# Patient Record
Sex: Male | Born: 1945
Health system: Southern US, Community
[De-identification: ages and names within clinical notes are randomized; demographics above are authoritative.]

## PROBLEM LIST (undated history)

## (undated) DIAGNOSIS — N183 Chronic kidney disease, stage 3 unspecified: Secondary | ICD-10-CM

## (undated) DIAGNOSIS — I739 Peripheral vascular disease, unspecified: Secondary | ICD-10-CM

## (undated) DIAGNOSIS — F419 Anxiety disorder, unspecified: Secondary | ICD-10-CM

## (undated) DIAGNOSIS — F17200 Nicotine dependence, unspecified, uncomplicated: Secondary | ICD-10-CM

## (undated) DIAGNOSIS — Z95818 Presence of other cardiac implants and grafts: Secondary | ICD-10-CM

## (undated) DIAGNOSIS — D126 Benign neoplasm of colon, unspecified: Secondary | ICD-10-CM

## (undated) DIAGNOSIS — E781 Pure hyperglyceridemia: Secondary | ICD-10-CM

## (undated) DIAGNOSIS — T7840XA Allergy, unspecified, initial encounter: Secondary | ICD-10-CM

## (undated) DIAGNOSIS — I639 Cerebral infarction, unspecified: Secondary | ICD-10-CM

## (undated) DIAGNOSIS — E785 Hyperlipidemia, unspecified: Secondary | ICD-10-CM

## (undated) HISTORY — DX: Benign neoplasm of colon, unspecified: D12.6

## (undated) HISTORY — DX: Nicotine dependence, unspecified, uncomplicated: F17.200

## (undated) HISTORY — DX: Peripheral vascular disease, unspecified: I73.9

## (undated) HISTORY — PX: EYE SURGERY: SHX253

## (undated) HISTORY — DX: Allergy, unspecified, initial encounter: T78.40XA

## (undated) HISTORY — DX: Hyperlipidemia, unspecified: E78.5

## (undated) HISTORY — DX: Chronic kidney disease, stage 3 (moderate): N18.3

## (undated) HISTORY — DX: Pure hyperglyceridemia: E78.1

## (undated) HISTORY — DX: Chronic kidney disease, stage 3 unspecified: N18.30

---

## 1985-01-06 HISTORY — PX: CHOLECYSTECTOMY: SHX55

## 2012-06-30 ENCOUNTER — Encounter: Payer: Self-pay | Admitting: Family Medicine

## 2012-06-30 ENCOUNTER — Ambulatory Visit (INDEPENDENT_AMBULATORY_CARE_PROVIDER_SITE_OTHER): Payer: Medicare Other | Admitting: Family Medicine

## 2012-06-30 VITALS — BP 130/70 | HR 78 | Temp 97.6°F | Resp 16 | Ht 68.0 in | Wt 173.0 lb

## 2012-06-30 DIAGNOSIS — F17200 Nicotine dependence, unspecified, uncomplicated: Secondary | ICD-10-CM

## 2012-06-30 DIAGNOSIS — K635 Polyp of colon: Secondary | ICD-10-CM

## 2012-06-30 DIAGNOSIS — D126 Benign neoplasm of colon, unspecified: Secondary | ICD-10-CM

## 2012-06-30 DIAGNOSIS — Z125 Encounter for screening for malignant neoplasm of prostate: Secondary | ICD-10-CM

## 2012-06-30 DIAGNOSIS — E785 Hyperlipidemia, unspecified: Secondary | ICD-10-CM

## 2012-06-30 DIAGNOSIS — Z1211 Encounter for screening for malignant neoplasm of colon: Secondary | ICD-10-CM

## 2012-06-30 LAB — LIPID PANEL
HDL: 63 mg/dL (ref >39)
LDL Cholesterol: 94 mg/dL (ref 0–99)
Total CHOL/HDL Ratio: 2.8 Ratio
Triglycerides: 92 mg/dL (ref <150)
VLDL: 18 mg/dL (ref 0–40)

## 2012-06-30 LAB — COMPREHENSIVE METABOLIC PANEL
ALT: 25 U/L (ref 0–53)
AST: 21 U/L (ref 0–37)
Alkaline Phosphatase: 75 U/L (ref 39–117)
BUN: 18 mg/dL (ref 6–23)
Creat: 1.48 mg/dL — ABNORMAL HIGH (ref 0.50–1.35)
Total Bilirubin: 0.6 mg/dL (ref 0.3–1.2)

## 2012-06-30 LAB — CBC
HCT: 44.8 % (ref 39.0–52.0)
Hemoglobin: 15.4 g/dL (ref 13.0–17.0)
MCHC: 34.4 g/dL (ref 30.0–36.0)
MCV: 86.7 fL (ref 78.0–100.0)
RDW: 14.3 % (ref 11.5–15.5)

## 2012-06-30 MED ORDER — PRAVASTATIN SODIUM 40 MG PO TABS: 40.0000 mg | ORAL_TABLET | Freq: Every day | ORAL | Status: DC

## 2012-06-30 MED ORDER — FENOFIBRATE 160 MG PO TABS: 160.0000 mg | ORAL_TABLET | Freq: Every day | ORAL | Status: DC

## 2012-06-30 NOTE — Patient Instructions (Signed)
I recommend eye visit once a year I recommend dental visit every 6 months Goal is to  Exercise 30 minutes 5 days a week We will send a letter with lab results  Referral for colonoscopy  F/U 1 year as needed

## 2012-06-30 NOTE — Assessment & Plan Note (Signed)
LFT and FLP done today, within normal limits Continue pravastatin current dose

## 2012-06-30 NOTE — Assessment & Plan Note (Signed)
Counseled on tobacco cessation, pt not ready to quit

## 2012-06-30 NOTE — Addendum Note (Signed)
Addended by: Milinda Antis F on: 06/30/2012 09:49 PM   Modules accepted: Orders

## 2012-06-30 NOTE — Assessment & Plan Note (Signed)
Refer to GI, last colonoscopy in [place] in 2009-2 polyps at that time Previous Colonoscopy 2005

## 2012-06-30 NOTE — Progress Notes (Signed)
  Subjective:    Patient ID: Christian [name], male    DOB: [birth year], 67 y.o.   MRN: [id number]  HPI  Pt here for CPE he has no specific concerns, doing well.  Medications and History reviewed Seen by opthomolothogy Smokes 6-7 cig/day not interested in quitting Immunizations UTD  Review of Systems  GEN- denies fatigue, fever, weight loss,weakness, recent illness HEENT- denies eye drainage, change in vision, nasal discharge, CVS- denies chest pain, palpitations RESP- denies SOB, cough, wheeze ABD- denies N/V, change in stools, abd pain GU- denies dysuria, hematuria, dribbling, incontinence MSK- denies joint pain, muscle aches, injury Neuro- denies headache, dizziness, syncope, seizure activity      Objective:   Physical Exam GEN- NAD, alert and oriented x3 HEENT- PERRL, EOMI, non injected sclera, pink conjunctiva, MMM, oropharynx clear Neck- Supple, no bruit CVS- RRR, no murmur RESP-CTAB ABD-NABS,soft,NT,ND Rectum- normal rectal tone, no external appearance, FOBT neg, prostate smooth no nodules, firm upper quadrants EXT- No edema Pulses- Radial, DP- 2+ Skin- in tact, no open lesions NEURO-CNII-XII intact no focal deficits          Assessment & Plan:   CPE- Physical completed, due for repeat colonoscopy

## 2012-07-05 ENCOUNTER — Other Ambulatory Visit: Payer: Self-pay | Admitting: Family Medicine

## 2012-07-15 ENCOUNTER — Encounter: Payer: Self-pay | Admitting: Gastroenterology

## 2012-08-06 ENCOUNTER — Other Ambulatory Visit: Payer: Medicare Other

## 2012-08-06 DIAGNOSIS — R7989 Other specified abnormal findings of blood chemistry: Secondary | ICD-10-CM

## 2012-08-06 LAB — COMPLETE METABOLIC PANEL WITH GFR
ALT: 22 U/L (ref 0–53)
Albumin: 4 g/dL (ref 3.5–5.2)
CO2: 25 mEq/L (ref 19–32)
Calcium: 9.2 mg/dL (ref 8.4–10.5)
Chloride: 104 mEq/L (ref 96–112)
GFR, Est African American: 50 mL/min — ABNORMAL LOW
Potassium: 4.4 mEq/L (ref 3.5–5.3)
Sodium: 138 mEq/L (ref 135–145)
Total Protein: 6.4 g/dL (ref 6.0–8.3)

## 2012-08-08 ENCOUNTER — Other Ambulatory Visit: Payer: Self-pay | Admitting: Family Medicine

## 2012-08-08 DIAGNOSIS — N289 Disorder of kidney and ureter, unspecified: Secondary | ICD-10-CM

## 2012-08-13 ENCOUNTER — Ambulatory Visit
Admission: RE | Admit: 2012-08-13 | Discharge: 2012-08-13 | Disposition: A | Discharge status: Home / Self Care | Payer: Medicare Other | Source: Ambulatory Visit | Attending: Family Medicine | Admitting: Family Medicine

## 2012-08-13 DIAGNOSIS — N289 Disorder of kidney and ureter, unspecified: Secondary | ICD-10-CM

## 2012-08-17 ENCOUNTER — Other Ambulatory Visit: Payer: Self-pay | Admitting: Family Medicine

## 2012-08-17 DIAGNOSIS — N289 Disorder of kidney and ureter, unspecified: Secondary | ICD-10-CM

## 2012-09-29 ENCOUNTER — Telehealth: Payer: Self-pay | Admitting: *Deleted

## 2012-09-29 ENCOUNTER — Ambulatory Visit (AMBULATORY_SURGERY_CENTER): Payer: Self-pay | Admitting: *Deleted

## 2012-09-29 ENCOUNTER — Encounter: Payer: Self-pay | Admitting: Gastroenterology

## 2012-09-29 VITALS — Ht 69.0 in | Wt 180.4 lb

## 2012-09-29 DIAGNOSIS — Z86010 Personal history of colonic polyps: Secondary | ICD-10-CM

## 2012-09-29 MED ORDER — NA SULFATE-K SULFATE-MG SULF 17.5-3.13-1.6 GM/177ML PO SOLN: 1.0000 | Freq: Once | ORAL | Status: DC

## 2012-09-29 NOTE — Telephone Encounter (Signed)
Pt scheduled for direct colonoscopy with Dr. Arlyce Dice Friday 10/3.  Last colonoscopy 2009 with Dr. Almira Bar in [facility] [place], [place].  Pt says he had polyps.  Pt had colonoscopy 2005 with same MD, hyperplastic polyps.  This report is in EPIC, no path report.  Release of information form signed and given to [employer].

## 2012-09-29 NOTE — Telephone Encounter (Signed)
Faxed today

## 2012-10-08 ENCOUNTER — Encounter: Payer: Medicare Other | Admitting: Gastroenterology

## 2012-10-18 ENCOUNTER — Telehealth: Payer: Self-pay | Admitting: Gastroenterology

## 2012-10-18 NOTE — Telephone Encounter (Signed)
Explained to patient that we have not received path report yet. Tried to contact that office and they are closed.  Dr Arlyce Dice I explained to pt unless he heard from [place] that his colonoscopy is scheduled for the 16th

## 2012-10-18 NOTE — Telephone Encounter (Signed)
Hx of colon polyps which notes say hyperplastic in 2005. But we have no path to confirm it   So this is a recall colon

## 2012-10-18 NOTE — Telephone Encounter (Signed)
So continue with colonoscopy right?

## 2012-10-18 NOTE — Telephone Encounter (Signed)
Ok

## 2012-10-18 NOTE — Telephone Encounter (Signed)
What is the reason for the colonoscopy?

## 2012-10-19 NOTE — Telephone Encounter (Signed)
Ok patient aware to continue with Colonoscopy on the 16th

## 2012-10-19 NOTE — Telephone Encounter (Signed)
Needs colo 10 years from previous colo which is about now

## 2012-10-21 ENCOUNTER — Ambulatory Visit (AMBULATORY_SURGERY_CENTER): Payer: Medicare Other | Admitting: Gastroenterology

## 2012-10-21 ENCOUNTER — Encounter: Payer: Self-pay | Admitting: Gastroenterology

## 2012-10-21 VITALS — BP 126/82 | HR 60 | Temp 96.6°F | Resp 15 | Ht 69.0 in | Wt 180.0 lb

## 2012-10-21 DIAGNOSIS — K5730 Diverticulosis of large intestine without perforation or abscess without bleeding: Secondary | ICD-10-CM

## 2012-10-21 DIAGNOSIS — D126 Benign neoplasm of colon, unspecified: Secondary | ICD-10-CM

## 2012-10-21 DIAGNOSIS — K648 Other hemorrhoids: Secondary | ICD-10-CM

## 2012-10-21 DIAGNOSIS — Z86010 Personal history of colonic polyps: Secondary | ICD-10-CM

## 2012-10-21 MED ORDER — SODIUM CHLORIDE 0.9 % IV SOLN: 500.0000 mL | INTRAVENOUS | Status: DC

## 2012-10-21 NOTE — Progress Notes (Signed)
Patient did not experience any of the following events: a burn prior to discharge; a fall within the facility; wrong site/side/patient/procedure/implant event; or a hospital transfer or hospital admission upon discharge from the facility. (G8907) Patient did not have preoperative order for IV antibiotic SSI prophylaxis. (G8918) Patient did not have preoperative order for IV antibiotic SSI prophylaxis. (G8918)  

## 2012-10-21 NOTE — Progress Notes (Signed)
Report to pacu rn, vss, bbs=clear 

## 2012-10-21 NOTE — Op Note (Signed)
[facility] [address] [place], [address]   COLONOSCOPY PROCEDURE REPORT  PATIENT: Christian [name], Christian [name]  MR#: [id number] BIRTHDATE: December 31, 1945 , 67  yrs. old GENDER: Male ENDOSCOPIST: Louis Meckel, MD REFERRED OZ:[id number] Tanya Nones, M.D. PROCEDURE DATE:  10/21/2012 PROCEDURE:   Colonoscopy with snare polypectomy and Colonoscopy with cold biopsy polypectomy First Screening Colonoscopy - Avg.  risk and is 50 yrs.  old or older - No.  Prior Negative Screening - Now for repeat screening. N/A  History of Adenoma - Now for follow-up colonoscopy & has been > or = to 3 yrs.  Yes hx of adenoma.  Has been 3 or more years since last colonoscopy.  Polyps Removed Today? Yes. ASA CLASS:   Class II INDICATIONS:Patient's personal history of adenomatous colon polyps. polyps removed 2009 MEDICATIONS: MAC sedation, administered by CRNA and propofol (Diprivan) 350mg  IV  DESCRIPTION OF PROCEDURE:   After the risks benefits and alternatives of the procedure were thoroughly explained, informed consent was obtained.  A digital rectal exam revealed no abnormalities of the rectum.   The LB [id number]  endoscope was introduced through the anus and advanced to the cecum, which was identified by both the appendix and ileocecal valve. No adverse events experienced.   The quality of the prep was Suprep good  The instrument was then slowly withdrawn as the colon was fully examined.      COLON FINDINGS: A flat polyp measuring 3 mm in size was found in the ascending colon.  A polypectomy was performed with a cold snare. The resection was complete and the polyp tissue was completely retrieved.   A sessile polyp measuring 2-3 mm in size was found in the distal transverse colon.  A polypectomy was performed with cold forceps.   A sessile polyp measuring 5 mm in size was found in the descending colon.  A polypectomy was performed with a cold snare. The resection was complete and the polyp  tissue was completely retrieved.   A flat polyp measuring 2 mm in size was found in the sigmoid colon.  A polypectomy was performed with cold forceps. Mild diverticulosis was noted in the transverse colon.   Moderate diverticulosis was noted in the sigmoid colon.   Internal hemorrhoids were found.  Retroflexed views revealed no abnormalities. The time to cecum=3 minutes 18 seconds.  Withdrawal time=16 minutes 11 seconds.  The scope was withdrawn and the procedure completed. COMPLICATIONS: There were no complications.  ENDOSCOPIC IMPRESSION: 1.   Flat polyp measuring 3 mm in size was found in the ascending colon; polypectomy was performed with a cold snare 2.   Sessile polyp measuring 2-3 mm in size was found in the distal transverse colon; polypectomy was performed with cold forceps 3.   Sessile polyp measuring 5 mm in size was found in the descending colon; polypectomy was performed with a cold snare 4.   Flat polyp measuring 2 mm in size was found in the sigmoid colon; polypectomy was performed with cold forceps 5.   Mild diverticulosis was noted in the transverse colon 6.   Moderate diverticulosis was noted in the sigmoid colon 7.   Internal hemorrhoids  RECOMMENDATIONS: If the polyp(s) removed today are proven to be adenomatous (pre-cancerous) polyps, you will need a colonoscopy in 3 years. Otherwise you should continue to follow colorectal cancer screening guidelines for "routine risk" patients with a colonoscopy in 10 years.  You will receive a letter within 1-2 weeks with the results of your biopsy  as well as final recommendations.  Please call my office if you have not received a letter after 3 weeks.   eSigned:  Louis Meckel, MD 10/21/2012 9:26 AM   cc:   PATIENT NAME:  Christian [name] MR#: [id number]

## 2012-10-21 NOTE — Patient Instructions (Addendum)

## 2012-10-21 NOTE — Progress Notes (Signed)
Called to room to assist during endoscopic procedure.  Patient ID and intended procedure confirmed with present staff. Received instructions for my participation in the procedure from the performing physician.  

## 2012-10-22 ENCOUNTER — Telehealth: Payer: Self-pay | Admitting: *Deleted

## 2012-10-22 NOTE — Telephone Encounter (Signed)
  Follow up Call-  Call back number 10/21/2012  Post procedure Call Back phone  # [phone]  Permission to leave phone message Yes     Patient questions:  Do you have a fever, pain , or abdominal swelling? no Pain Score  0 *  Have you tolerated food without any problems? yes  Have you been able to return to your normal activities? yes  Do you have any questions about your discharge instructions: Diet   no Medications  no Follow up visit  no  Do you have questions or concerns about your Care? no  Actions: * If pain score is 4 or above: No action needed, pain <4.

## 2012-10-28 ENCOUNTER — Encounter: Payer: Self-pay | Admitting: Gastroenterology

## 2013-06-10 ENCOUNTER — Other Ambulatory Visit: Payer: Self-pay | Admitting: *Deleted

## 2013-06-10 MED ORDER — FENOFIBRATE 160 MG PO TABS: ORAL_TABLET | ORAL | Status: DC

## 2013-06-13 ENCOUNTER — Other Ambulatory Visit: Payer: Self-pay | Admitting: *Deleted

## 2013-06-13 MED ORDER — PRAVASTATIN SODIUM 40 MG PO TABS: 40.0000 mg | ORAL_TABLET | Freq: Every day | ORAL | Status: DC

## 2013-06-13 NOTE — Telephone Encounter (Signed)
Medication filled x1 with no refills.   Requires office visit before any further refills can be given.   Letter sent.  

## 2013-06-28 ENCOUNTER — Encounter: Payer: Medicare Other | Admitting: Family Medicine

## 2013-07-01 ENCOUNTER — Ambulatory Visit (INDEPENDENT_AMBULATORY_CARE_PROVIDER_SITE_OTHER): Payer: Medicare Other | Admitting: Family Medicine

## 2013-07-01 ENCOUNTER — Encounter: Payer: Self-pay | Admitting: Family Medicine

## 2013-07-01 VITALS — BP 110/68 | HR 60 | Temp 97.0°F | Resp 14 | Ht 69.5 in | Wt 175.0 lb

## 2013-07-01 DIAGNOSIS — Z0000 Encounter for general adult medical examination without abnormal findings: Secondary | ICD-10-CM

## 2013-07-01 DIAGNOSIS — E781 Pure hyperglyceridemia: Secondary | ICD-10-CM | POA: Insufficient documentation

## 2013-07-01 DIAGNOSIS — F17200 Nicotine dependence, unspecified, uncomplicated: Secondary | ICD-10-CM | POA: Insufficient documentation

## 2013-07-01 DIAGNOSIS — Z79899 Other long term (current) drug therapy: Secondary | ICD-10-CM

## 2013-07-01 DIAGNOSIS — Z23 Encounter for immunization: Secondary | ICD-10-CM

## 2013-07-01 LAB — COMPLETE METABOLIC PANEL WITH GFR
ALT: 22 U/L (ref 0–53)
AST: 21 U/L (ref 0–37)
Albumin: 4.6 g/dL (ref 3.5–5.2)
Alkaline Phosphatase: 80 U/L (ref 39–117)
BUN: 16 mg/dL (ref 6–23)
CALCIUM: 9.5 mg/dL (ref 8.4–10.5)
CHLORIDE: 101 meq/L (ref 96–112)
CO2: 25 mEq/L (ref 19–32)
CREATININE: 1.42 mg/dL — AB (ref 0.50–1.35)
GFR, EST AFRICAN AMERICAN: 58 mL/min — AB
GFR, EST NON AFRICAN AMERICAN: 50 mL/min — AB
GLUCOSE: 103 mg/dL — AB (ref 70–99)
Potassium: 5 mEq/L (ref 3.5–5.3)
Sodium: 139 mEq/L (ref 135–145)
Total Bilirubin: 0.6 mg/dL (ref 0.2–1.2)
Total Protein: 6.9 g/dL (ref 6.0–8.3)

## 2013-07-01 LAB — LIPID PANEL
CHOL/HDL RATIO: 2.5 ratio
CHOLESTEROL: 165 mg/dL (ref 0–200)
HDL: 65 mg/dL (ref >39)
LDL Cholesterol: 80 mg/dL (ref 0–99)
Triglycerides: 102 mg/dL (ref <150)
VLDL: 20 mg/dL (ref 0–40)

## 2013-07-01 LAB — CBC WITH DIFFERENTIAL/PLATELET
Basophils Absolute: 0.1 10*3/uL (ref 0.0–0.1)
Basophils Relative: 1 % (ref 0–1)
Eosinophils Absolute: 0.3 10*3/uL (ref 0.0–0.7)
Eosinophils Relative: 4 % (ref 0–5)
HCT: 44.2 % (ref 39.0–52.0)
Hemoglobin: 15.2 g/dL (ref 13.0–17.0)
LYMPHS ABS: 2.9 10*3/uL (ref 0.7–4.0)
LYMPHS PCT: 38 % (ref 12–46)
MCH: 29.6 pg (ref 26.0–34.0)
MCHC: 34.4 g/dL (ref 30.0–36.0)
MCV: 86 fL (ref 78.0–100.0)
Monocytes Absolute: 0.6 10*3/uL (ref 0.1–1.0)
Monocytes Relative: 8 % (ref 3–12)
NEUTROS ABS: 3.8 10*3/uL (ref 1.7–7.7)
NEUTROS PCT: 49 % (ref 43–77)
PLATELETS: 337 10*3/uL (ref 150–400)
RBC: 5.14 MIL/uL (ref 4.22–5.81)
RDW: 14.2 % (ref 11.5–15.5)
WBC: 7.7 10*3/uL (ref 4.0–10.5)

## 2013-07-01 MED ORDER — FLUTICASONE PROPIONATE 50 MCG/ACT NA SUSP: 2.0000 | Freq: Every day | NASAL | Status: DC

## 2013-07-01 NOTE — Progress Notes (Signed)
Subjective:    Patient ID: Christian [name], male    DOB: 07/09/1945, 68 y.o.   MRN: [id number]  HPI  Subjective:   Patient presents for Medicare Annual/Subsequent preventive examination. Patient's only concern is some mild sinus irritation and postnasal drip.  He continues to smoke. He has no desire to quit at the present time.  Review Past Medical/Family/Social: Past Medical History  Diagnosis Date  . Allergy     seasonal  . Hyperlipidemia   . Hypertriglyceridemia   . Smoker    Past Surgical History  Procedure Laterality Date  . Cholecystectomy  1987  . Eye surgery      lasik   Current Outpatient Prescriptions on File Prior to Visit  Medication Sig Dispense Refill  . cholecalciferol (VITAMIN D) 1000 UNITS tablet Take 1,000 Units by mouth daily.      . fenofibrate 160 MG tablet TAKE 1 TABLET EVERY DAY  90 tablet  3  . Multiple Vitamins-Minerals (MULTIVITAMIN WITH MINERALS) tablet Take 1 tablet by mouth daily.      . pravastatin (PRAVACHOL) 40 MG tablet Take 1 tablet (40 mg total) by mouth daily.  90 tablet  0  . vitamin C (ASCORBIC ACID) 500 MG tablet Take 500 mg by mouth daily.       No current facility-administered medications on file prior to visit.   No Known Allergies History   Social History  . Marital Status: Married    Spouse Name: N/A    Number of Children: N/A  . Years of Education: N/A   Occupational History  . Not on file.   Social History Main Topics  . Smoking status: Current Every Day Smoker -- 0.30 packs/day    Types: Cigarettes  . Smokeless tobacco: Never Used  . Alcohol Use: 3.5 oz/week    7 drink(s) per week  . Drug Use: No  . Sexual Activity: Not on file   Other Topics Concern  . Not on file   Social History Narrative  . No narrative on file   Family History  Problem Relation Age of Onset  . Hypertension Mother   . Stroke Mother   . Diabetes Mother     borderline  . Arthritis Father   . Colon cancer Neg Hx   . Esophageal cancer  Neg Hx   . Rectal cancer Neg Hx   . Stomach cancer Neg Hx    Depression Screen  (Note: if answer to either of the following is "Yes", a more complete depression screening is indicated)  Over the past two weeks, have you felt down, depressed or hopeless? No Over the past two weeks, have you felt little interest or pleasure in doing things? No Have you lost interest or pleasure in daily life? No Do you often feel hopeless? No Do you cry easily over simple problems? No   Activities of Daily Living  In your present state of health, do you have any difficulty performing the following activities?:  Driving? No  Managing money? No  Feeding yourself? No  Getting from bed to chair? No  Climbing a flight of stairs? No  Preparing food and eating?: No  Bathing or showering? No  Getting dressed: No  Getting to the toilet? No  Using the toilet:No  Moving around from place to place: No  In the past year have you fallen or had a near fall?:No  Are you sexually active? No  Do you have more than one partner? No   Hearing Difficulties:  No  Do you often ask people to speak up or repeat themselves? No  Do you experience ringing or noises in your ears? No Do you have difficulty understanding soft or whispered voices? No  Do you feel that you have a problem with memory? No Do you often misplace items? No  Do you feel safe at home? Yes  Cognitive Testing  Alert? Yes Normal Appearance?Yes  Oriented to person? Yes Place? Yes  Time? Yes  Recall of three objects? Yes  Can perform simple calculations? Yes  Displays appropriate judgment?Yes  Can read the correct time from a watch face?Yes   Screening Tests / Date Colonoscopy         10/14            Zostavax UTD Pneumovax Due Influenza Vaccine UTD Tetanus/tdap8 yrs ago  Review of Systems  All other systems reviewed and are negative.      Objective:   Physical Exam  Vitals reviewed. Constitutional: He is oriented to person, place, and  time. He appears well-developed and well-nourished. No distress.  HENT:  Head: Normocephalic and atraumatic.  Right Ear: External ear normal.  Left Ear: External ear normal.  Nose: Nose normal.  Mouth/Throat: Oropharynx is clear and moist. No oropharyngeal exudate.  Eyes: Conjunctivae and EOM are normal. Pupils are equal, round, and reactive to light. Right eye exhibits no discharge. Left eye exhibits no discharge. No scleral icterus.  Neck: Normal range of motion. Neck supple. No JVD present. No tracheal deviation present. No thyromegaly present.  Cardiovascular: Normal rate, regular rhythm, normal heart sounds and intact distal pulses.  Exam reveals no gallop and no friction rub.   No murmur heard. Pulmonary/Chest: Effort normal and breath sounds normal. No stridor. No respiratory distress. He has no wheezes. He has no rales. He exhibits no tenderness.  Abdominal: Soft. Bowel sounds are normal. He exhibits no distension and no mass. There is no tenderness. There is no rebound and no guarding.  Genitourinary: Rectum normal, prostate normal and penis normal.  Musculoskeletal: Normal range of motion. He exhibits no edema and no tenderness.  Lymphadenopathy:    He has no cervical adenopathy.  Neurological: He is alert and oriented to person, place, and time. He has normal reflexes. He displays normal reflexes. No cranial nerve deficit. He exhibits normal muscle tone. Coordination normal.  Skin: Skin is warm. No rash noted. He is not diaphoretic. No erythema. No pallor.  Psychiatric: He has a normal mood and affect. His behavior is normal. Judgment and thought content normal.          Assessment & Plan:  1. Routine general medical examination at a health care facility Physical exam is normal. I recommended smoking cessation. I discussed Chantix. The patient is unwilling to quit at the present time. He has never had Pneumovax 23. There give the patient Prevnar 13 today. He will receive  Pneumovax 23 next year. His colon cancer screening and prostate cancer screening are up to date. The remainder of his preventive care is up to date.  Screen negative for depression. Prescribed patient Flonase 2 sprays each nostril daily for allergic sinusitis/rhinitis. Medicare Attestation  I have personally reviewed:  The patient's medical and social history  Their use of alcohol, tobacco or illicit drugs  Their current medications and supplements  The patient's functional ability including ADLs,fall risks, home safety risks, cognitive, and hearing and visual impairment  Diet and physical activities  Evidence for depression or mood disorders  The patient's  weight, height, BMI, and visual acuity have been recorded in the chart. I have made referrals, counseling, and provided education to the patient based on review of the above and I have provided the patient with a written personalized care plan for preventive services.

## 2013-07-01 NOTE — Addendum Note (Signed)
Addended by: Shary Decamp B on: 07/01/2013 09:00 AM   Modules accepted: Orders

## 2013-07-02 LAB — PSA, MEDICARE: PSA: 0.53 ng/mL (ref <=4.00)

## 2013-09-07 ENCOUNTER — Other Ambulatory Visit: Payer: Self-pay | Admitting: Family Medicine

## 2014-03-10 ENCOUNTER — Encounter: Payer: Self-pay | Admitting: Family Medicine

## 2014-03-16 ENCOUNTER — Other Ambulatory Visit: Payer: Self-pay | Admitting: Family Medicine

## 2014-03-16 NOTE — Telephone Encounter (Signed)
Refill appropriate and filled per protocol. 

## 2014-06-16 ENCOUNTER — Other Ambulatory Visit: Payer: Self-pay | Admitting: Family Medicine

## 2014-06-16 NOTE — Telephone Encounter (Signed)
Refill appropriate and filled per protocol. 

## 2014-07-04 ENCOUNTER — Encounter: Payer: Medicare Other | Admitting: Family Medicine

## 2014-07-14 ENCOUNTER — Encounter: Payer: Self-pay | Admitting: Family Medicine

## 2014-07-27 ENCOUNTER — Ambulatory Visit (INDEPENDENT_AMBULATORY_CARE_PROVIDER_SITE_OTHER): Payer: Medicare Other | Admitting: Family Medicine

## 2014-07-27 ENCOUNTER — Encounter: Payer: Self-pay | Admitting: Family Medicine

## 2014-07-27 VITALS — BP 104/62 | HR 60 | Temp 97.9°F | Resp 14 | Ht 69.0 in | Wt 177.0 lb

## 2014-07-27 DIAGNOSIS — Z136 Encounter for screening for cardiovascular disorders: Secondary | ICD-10-CM | POA: Diagnosis not present

## 2014-07-27 DIAGNOSIS — E785 Hyperlipidemia, unspecified: Secondary | ICD-10-CM | POA: Diagnosis not present

## 2014-07-27 DIAGNOSIS — Z0000 Encounter for general adult medical examination without abnormal findings: Secondary | ICD-10-CM | POA: Diagnosis not present

## 2014-07-27 LAB — COMPLETE METABOLIC PANEL WITH GFR
ALT: 21 U/L (ref 0–53)
AST: 18 U/L (ref 0–37)
Albumin: 3.9 g/dL (ref 3.5–5.2)
Alkaline Phosphatase: 68 U/L (ref 39–117)
BILIRUBIN TOTAL: 0.6 mg/dL (ref 0.2–1.2)
BUN: 17 mg/dL (ref 6–23)
CO2: 23 meq/L (ref 19–32)
Calcium: 9.4 mg/dL (ref 8.4–10.5)
Chloride: 105 mEq/L (ref 96–112)
Creat: 1.32 mg/dL (ref 0.50–1.35)
GFR, EST AFRICAN AMERICAN: 63 mL/min
GFR, EST NON AFRICAN AMERICAN: 54 mL/min — AB
GLUCOSE: 98 mg/dL (ref 70–99)
POTASSIUM: 4.3 meq/L (ref 3.5–5.3)
Sodium: 140 mEq/L (ref 135–145)
Total Protein: 6.5 g/dL (ref 6.0–8.3)

## 2014-07-27 LAB — CBC WITH DIFFERENTIAL/PLATELET
BASOS ABS: 0.1 10*3/uL (ref 0.0–0.1)
Basophils Relative: 1 % (ref 0–1)
EOS PCT: 4 % (ref 0–5)
Eosinophils Absolute: 0.3 10*3/uL (ref 0.0–0.7)
HEMATOCRIT: 44.6 % (ref 39.0–52.0)
Hemoglobin: 14.7 g/dL (ref 13.0–17.0)
LYMPHS ABS: 3 10*3/uL (ref 0.7–4.0)
Lymphocytes Relative: 37 % (ref 12–46)
MCH: 29.1 pg (ref 26.0–34.0)
MCHC: 33 g/dL (ref 30.0–36.0)
MCV: 88.1 fL (ref 78.0–100.0)
MPV: 9.5 fL (ref 8.6–12.4)
Monocytes Absolute: 0.8 10*3/uL (ref 0.1–1.0)
Monocytes Relative: 10 % (ref 3–12)
NEUTROS ABS: 3.8 10*3/uL (ref 1.7–7.7)
Neutrophils Relative %: 48 % (ref 43–77)
Platelets: 318 10*3/uL (ref 150–400)
RBC: 5.06 MIL/uL (ref 4.22–5.81)
RDW: 14.7 % (ref 11.5–15.5)
WBC: 8 10*3/uL (ref 4.0–10.5)

## 2014-07-27 LAB — LIPID PANEL
CHOL/HDL RATIO: 2.9 ratio
Cholesterol: 164 mg/dL (ref 0–200)
HDL: 57 mg/dL (ref >=40)
LDL Cholesterol: 83 mg/dL (ref 0–99)
Triglycerides: 120 mg/dL (ref <150)
VLDL: 24 mg/dL (ref 0–40)

## 2014-07-27 NOTE — Progress Notes (Signed)
Subjective:    Patient ID: Christian [name], male    DOB: 19-Sep-1945, 69 y.o.   MRN: [id number]  HPI Patient is here today for complete physical exam. Aside from some seasonal allergies he has no complaints. His blood pressure is well controlled at 104/62. The remainder of his review of systems is normal. He denies any chest pain shortness of breath or dyspnea on exertion. Unfortunately the patient continues to smoke. At the present time he is not interested in smoking cessation. His last colonoscopy was 2014. He is due for prostate exam. Patient has had Pneumovax, Prevnar, Zostavax, and his tetanus vaccine. He is due for an ultrasound to screen for AAA given his age and smoking. Past Medical History  Diagnosis Date  . Allergy     seasonal  . Hyperlipidemia   . Hypertriglyceridemia   . Smoker    Past Surgical History  Procedure Laterality Date  . Cholecystectomy  1987  . Eye surgery      lasik   Current Outpatient Prescriptions on File Prior to Visit  Medication Sig Dispense Refill  . cholecalciferol (VITAMIN D) 1000 UNITS tablet Take 1,000 Units by mouth daily.    . fenofibrate 160 MG tablet TAKE 1 TABLET EVERY DAY 90 tablet 3  . fluticasone (FLONASE) 50 MCG/ACT nasal spray Place 2 sprays into both nostrils daily. 16 g 6  . Multiple Vitamins-Minerals (MULTIVITAMIN WITH MINERALS) tablet Take 1 tablet by mouth daily.    . pravastatin (PRAVACHOL) 40 MG tablet TAKE 1 TABLET (40 MG TOTAL) BY MOUTH DAILY. 90 tablet 1  . vitamin C (ASCORBIC ACID) 500 MG tablet Take 500 mg by mouth daily.     No current facility-administered medications on file prior to visit.   No Known Allergies History   Social History  . Marital Status: Married    Spouse Name: N/A  . Number of Children: N/A  . Years of Education: N/A   Occupational History  . Not on file.   Social History Main Topics  . Smoking status: Current Every Day Smoker -- 0.30 packs/day    Types: Cigarettes  . Smokeless tobacco:  Never Used  . Alcohol Use: 3.5 oz/week    7 drink(s) per week  . Drug Use: No  . Sexual Activity: Not on file   Other Topics Concern  . Not on file   Social History Narrative   Family History  Problem Relation Age of Onset  . Hypertension Mother   . Stroke Mother   . Diabetes Mother     borderline  . Arthritis Father   . Colon cancer Neg Hx   . Esophageal cancer Neg Hx   . Rectal cancer Neg Hx   . Stomach cancer Neg Hx       Review of Systems  All other systems reviewed and are negative.      Objective:   Physical Exam  Constitutional: He is oriented to person, place, and time. He appears well-developed and well-nourished. No distress.  HENT:  Head: Normocephalic and atraumatic.  Right Ear: External ear normal.  Left Ear: External ear normal.  Nose: Nose normal.  Mouth/Throat: Oropharynx is clear and moist. No oropharyngeal exudate.  Eyes: Conjunctivae and EOM are normal. Pupils are equal, round, and reactive to light. Right eye exhibits no discharge. Left eye exhibits no discharge. No scleral icterus.  Neck: Normal range of motion. Neck supple. No JVD present. No tracheal deviation present. No thyromegaly present.  Cardiovascular: Normal rate, regular rhythm, normal  heart sounds and intact distal pulses.  Exam reveals no gallop and no friction rub.   No murmur heard. Pulmonary/Chest: Effort normal and breath sounds normal. No stridor. No respiratory distress. He has no wheezes. He has no rales. He exhibits no tenderness.  Abdominal: Soft. Bowel sounds are normal. He exhibits no distension and no mass. There is no tenderness. There is no rebound and no guarding.  Genitourinary: Rectum normal, prostate normal and penis normal.  Musculoskeletal: Normal range of motion. He exhibits no edema or tenderness.  Lymphadenopathy:    He has no cervical adenopathy.  Neurological: He is alert and oriented to person, place, and time. He has normal reflexes. He displays normal  reflexes. No cranial nerve deficit. He exhibits normal muscle tone. Coordination normal.  Skin: Skin is warm. No rash noted. He is not diaphoretic. No erythema. No pallor.  Psychiatric: He has a normal mood and affect. His behavior is normal. Judgment and thought content normal.  Vitals reviewed.         Assessment & Plan:  Routine general medical examination at a health care facility - Plan: COMPLETE METABOLIC PANEL WITH GFR, CBC with Differential/Platelet, Lipid panel, PSA, Medicare  HLD (hyperlipidemia)  Screening for AAA (abdominal aortic aneurysm) - Plan: US Aorta  Patient's physical exam is normal. I encouraged smoking cessation. I will check a CBC, CMP, fasting lipid panel, and PSA. I will schedule the patient for an ultrasound to evaluate for abdominal aortic aneurysm given his age and smoking. Otherwise the remainder of his preventative care is up-to-date

## 2014-07-28 ENCOUNTER — Telehealth: Payer: Self-pay | Admitting: *Deleted

## 2014-07-28 LAB — PSA, MEDICARE: PSA: 0.48 ng/mL (ref <=4.00)

## 2014-07-28 NOTE — Telephone Encounter (Signed)
Pt has appt scheduled July  28 at 8:25am at [facility], ot is to be nothing by mouth after midnight night before his exam, left message on vm to return my call

## 2014-07-28 NOTE — Telephone Encounter (Signed)
Called pt about labs and he was informed of this appt, time and place.

## 2014-08-03 ENCOUNTER — Ambulatory Visit
Admission: RE | Admit: 2014-08-03 | Discharge: 2014-08-03 | Disposition: A | Discharge status: Home / Self Care | Payer: Medicare Other | Source: Ambulatory Visit | Attending: Family Medicine | Admitting: Family Medicine

## 2014-08-03 DIAGNOSIS — Z136 Encounter for screening for cardiovascular disorders: Secondary | ICD-10-CM

## 2014-09-10 ENCOUNTER — Other Ambulatory Visit: Payer: Self-pay | Admitting: Family Medicine

## 2014-10-27 ENCOUNTER — Other Ambulatory Visit: Payer: Self-pay | Admitting: Family Medicine

## 2014-10-27 NOTE — Telephone Encounter (Signed)
Medication refilled per protocol. 

## 2015-06-07 ENCOUNTER — Other Ambulatory Visit: Payer: Self-pay | Admitting: Family Medicine

## 2015-06-07 NOTE — Telephone Encounter (Signed)
Medication refilled per protocol. 

## 2015-06-15 ENCOUNTER — Other Ambulatory Visit: Payer: Self-pay | Admitting: Family Medicine

## 2015-07-30 ENCOUNTER — Encounter: Payer: Self-pay | Admitting: Family Medicine

## 2015-07-30 ENCOUNTER — Ambulatory Visit (INDEPENDENT_AMBULATORY_CARE_PROVIDER_SITE_OTHER): Payer: Medicare Other | Admitting: Family Medicine

## 2015-07-30 ENCOUNTER — Other Ambulatory Visit: Payer: Self-pay | Admitting: Family Medicine

## 2015-07-30 VITALS — BP 112/80 | HR 68 | Temp 98.1°F | Resp 16 | Ht 69.0 in | Wt 177.0 lb

## 2015-07-30 DIAGNOSIS — Z0000 Encounter for general adult medical examination without abnormal findings: Secondary | ICD-10-CM

## 2015-07-30 DIAGNOSIS — Z23 Encounter for immunization: Secondary | ICD-10-CM

## 2015-07-30 DIAGNOSIS — E785 Hyperlipidemia, unspecified: Secondary | ICD-10-CM | POA: Diagnosis not present

## 2015-07-30 DIAGNOSIS — Z125 Encounter for screening for malignant neoplasm of prostate: Secondary | ICD-10-CM | POA: Diagnosis not present

## 2015-07-30 LAB — CBC WITH DIFFERENTIAL/PLATELET
BASOS ABS: 158 {cells}/uL (ref 0–200)
Basophils Relative: 2 %
EOS ABS: 395 {cells}/uL (ref 15–500)
Eosinophils Relative: 5 %
HEMATOCRIT: 44.4 % (ref 38.5–50.0)
Hemoglobin: 14.8 g/dL (ref 13.0–17.0)
LYMPHS PCT: 42 %
Lymphs Abs: 3318 cells/uL (ref 850–3900)
MCH: 29.6 pg (ref 27.0–33.0)
MCHC: 33.3 g/dL (ref 32.0–36.0)
MCV: 88.8 fL (ref 80.0–100.0)
MONO ABS: 711 {cells}/uL (ref 200–950)
MPV: 9.6 fL (ref 7.5–12.5)
Monocytes Relative: 9 %
NEUTROS PCT: 42 %
Neutro Abs: 3318 cells/uL (ref 1500–7800)
Platelets: 311 10*3/uL (ref 140–400)
RBC: 5 MIL/uL (ref 4.20–5.80)
RDW: 14.3 % (ref 11.0–15.0)
WBC: 7.9 10*3/uL (ref 3.8–10.8)

## 2015-07-30 LAB — COMPLETE METABOLIC PANEL WITH GFR
ALK PHOS: 72 U/L (ref 40–115)
ALT: 20 U/L (ref 9–46)
AST: 17 U/L (ref 10–35)
Albumin: 4.1 g/dL (ref 3.6–5.1)
BILIRUBIN TOTAL: 0.4 mg/dL (ref 0.2–1.2)
BUN: 16 mg/dL (ref 7–25)
CALCIUM: 9.2 mg/dL (ref 8.6–10.3)
CHLORIDE: 109 mmol/L (ref 98–110)
CO2: 24 mmol/L (ref 20–31)
CREATININE: 1.31 mg/dL — AB (ref 0.70–1.18)
GFR, EST AFRICAN AMERICAN: 63 mL/min (ref >=60)
GFR, EST NON AFRICAN AMERICAN: 54 mL/min — AB (ref >=60)
Glucose, Bld: 100 mg/dL — ABNORMAL HIGH (ref 70–99)
Potassium: 5.1 mmol/L (ref 3.5–5.3)
Sodium: 142 mmol/L (ref 135–146)
Total Protein: 6 g/dL — ABNORMAL LOW (ref 6.1–8.1)

## 2015-07-30 LAB — LIPID PANEL
CHOLESTEROL: 149 mg/dL (ref 125–200)
HDL: 60 mg/dL (ref >=40)
LDL Cholesterol: 70 mg/dL (ref <130)
TRIGLYCERIDES: 94 mg/dL (ref <150)
Total CHOL/HDL Ratio: 2.5 Ratio (ref <=5.0)
VLDL: 19 mg/dL (ref <30)

## 2015-07-30 NOTE — Progress Notes (Signed)
Subjective:     Patient ID: Christian [name], male   DOB: 02/15/1945, 70 y.o.   MRN: [id number]  HPI Patient is here today for complete physical exam. He denies any complaints. He does have 2 small plantar's warts on the plantar aspect of his left foot. Both are approximately 2 mm in diameter. I recommended Dr. Felicie Morn wart remover over-the-counter to treat these. Otherwise he is due for Pneumovax 23. Shingles vaccine is up-to-date. Tetanus vaccine is up-to-date. Colonoscopy is up-to-date. He is due for digital rectal exam as well as a PSA. He declines hepatitis C screening. He is still smoking and has no desire to quit. Past Medical History:  Diagnosis Date  . Allergy    seasonal  . Hyperlipidemia   . Hypertriglyceridemia   . Smoker    Past Surgical History:  Procedure Laterality Date  . CHOLECYSTECTOMY  1987  . EYE SURGERY     lasik   Current Outpatient Prescriptions on File Prior to Visit  Medication Sig Dispense Refill  . cholecalciferol (VITAMIN D) 1000 UNITS tablet Take 1,000 Units by mouth daily.    . fenofibrate 160 MG tablet TAKE 1 TABLET EVERY DAY 90 tablet 0  . fluticasone (FLONASE) 50 MCG/ACT nasal spray PLACE 2 SPRAYS INTO BOTH NOSTRILS DAILY. 16 g 4  . Multiple Vitamins-Minerals (MULTIVITAMIN WITH MINERALS) tablet Take 1 tablet by mouth daily.    . pravastatin (PRAVACHOL) 40 MG tablet TAKE 1 TABLET BY MOUTH EVERY DAY 90 tablet 0  . vitamin C (ASCORBIC ACID) 500 MG tablet Take 500 mg by mouth daily.     No current facility-administered medications on file prior to visit.    No Known Allergies Social History   Social History  . Marital status: Married    Spouse name: N/A  . Number of children: N/A  . Years of education: N/A   Occupational History  . Not on file.   Social History Main Topics  . Smoking status: Current Every Day Smoker    Packs/day: 0.30    Types: Cigarettes  . Smokeless tobacco: Never Used  . Alcohol use 3.5 oz/week    7 drink(s) per week  .  Drug use: No  . Sexual activity: Not on file   Other Topics Concern  . Not on file   Social History Narrative  . No narrative on file   Family History  Problem Relation Age of Onset  . Hypertension Mother   . Stroke Mother   . Diabetes Mother     borderline  . Arthritis Father   . Colon cancer Neg Hx   . Esophageal cancer Neg Hx   . Rectal cancer Neg Hx   . Stomach cancer Neg Hx      Review of Systems  All other systems reviewed and are negative.      Objective:   Physical Exam  Constitutional: He is oriented to person, place, and time. He appears well-developed and well-nourished. No distress.  HENT:  Head: Normocephalic and atraumatic.  Right Ear: External ear normal.  Left Ear: External ear normal.  Nose: Nose normal.  Mouth/Throat: Oropharynx is clear and moist. No oropharyngeal exudate.  Eyes: Conjunctivae and EOM are normal. Pupils are equal, round, and reactive to light. Right eye exhibits no discharge. Left eye exhibits no discharge. No scleral icterus.  Neck: Normal range of motion. Neck supple. No JVD present. No tracheal deviation present. No thyromegaly present.  Cardiovascular: Normal rate, regular rhythm, normal heart sounds and intact distal pulses.  Exam reveals no gallop and no friction rub.   No murmur heard. Pulmonary/Chest: Effort normal and breath sounds normal. No stridor. No respiratory distress. He has no wheezes. He has no rales. He exhibits no tenderness.  Abdominal: Soft. Bowel sounds are normal. He exhibits no distension and no mass. There is no tenderness. There is no rebound and no guarding.  Genitourinary: Rectum normal, prostate normal and penis normal.  Musculoskeletal: Normal range of motion. He exhibits no edema, tenderness or deformity.  Lymphadenopathy:    He has no cervical adenopathy.  Neurological: He is alert and oriented to person, place, and time. He has normal reflexes. He displays normal reflexes. No cranial nerve deficit. He  exhibits normal muscle tone. Coordination normal.  Skin: Skin is warm. No rash noted. He is not diaphoretic. No erythema. No pallor.  Psychiatric: He has a normal mood and affect. His behavior is normal. Judgment and thought content normal.  Vitals reviewed.      Assessment:     HLD (hyperlipidemia) - Plan: CBC with Differential/Platelet, COMPLETE METABOLIC PANEL WITH GFR, Lipid panel  Routine general medical examination at a health care facility - Plan: CBC with Differential/Platelet, COMPLETE METABOLIC PANEL WITH GFR, Lipid panel, PSA  Prostate cancer screening - Plan: PSA      Plan:     I recommended smoking cessation however the patient has no desire to quit at present. Check CBC, CMP, fasting lipid panel. Check PSA. Prostate exam is normal. He declines hepatitis C screening. Colonoscopy is up-to-date. The remainder of his physical exam is normal.

## 2015-07-30 NOTE — Telephone Encounter (Signed)
Refill appropriate and filled per protocol. 

## 2015-07-30 NOTE — Addendum Note (Signed)
Addended by: Shary Decamp B on: 07/30/2015 09:18 AM   Modules accepted: Orders

## 2015-07-31 LAB — PSA: PSA: 0.42 ng/mL (ref <=4.00)

## 2015-08-17 ENCOUNTER — Encounter: Payer: Self-pay | Admitting: Gastroenterology

## 2015-08-28 DIAGNOSIS — H40013 Open angle with borderline findings, low risk, bilateral: Secondary | ICD-10-CM | POA: Diagnosis not present

## 2015-08-29 ENCOUNTER — Encounter: Payer: Self-pay | Admitting: Gastroenterology

## 2015-09-10 ENCOUNTER — Other Ambulatory Visit: Payer: Self-pay | Admitting: Family Medicine

## 2016-03-10 ENCOUNTER — Other Ambulatory Visit: Payer: Self-pay | Admitting: Family Medicine

## 2016-06-11 ENCOUNTER — Ambulatory Visit: Payer: Medicare Other | Admitting: Family Medicine

## 2016-07-30 ENCOUNTER — Other Ambulatory Visit: Payer: Medicare Other

## 2016-07-30 DIAGNOSIS — E785 Hyperlipidemia, unspecified: Secondary | ICD-10-CM

## 2016-07-30 DIAGNOSIS — Z125 Encounter for screening for malignant neoplasm of prostate: Secondary | ICD-10-CM | POA: Diagnosis not present

## 2016-07-30 DIAGNOSIS — Z0000 Encounter for general adult medical examination without abnormal findings: Secondary | ICD-10-CM

## 2016-07-30 LAB — COMPREHENSIVE METABOLIC PANEL
ALK PHOS: 71 U/L (ref 40–115)
ALT: 18 U/L (ref 9–46)
AST: 16 U/L (ref 10–35)
Albumin: 4 g/dL (ref 3.6–5.1)
BUN: 13 mg/dL (ref 7–25)
CO2: 21 mmol/L (ref 20–31)
Calcium: 9.4 mg/dL (ref 8.6–10.3)
Chloride: 107 mmol/L (ref 98–110)
Creat: 1.44 mg/dL — ABNORMAL HIGH (ref 0.70–1.18)
GLUCOSE: 98 mg/dL (ref 70–99)
Potassium: 5.4 mmol/L — ABNORMAL HIGH (ref 3.5–5.3)
Sodium: 141 mmol/L (ref 135–146)
Total Bilirubin: 0.5 mg/dL (ref 0.2–1.2)
Total Protein: 6.3 g/dL (ref 6.1–8.1)

## 2016-07-30 LAB — LIPID PANEL
CHOL/HDL RATIO: 2.6 ratio (ref <5.0)
Cholesterol: 162 mg/dL (ref <200)
HDL: 62 mg/dL (ref >40)
LDL CALC: 82 mg/dL (ref <100)
Triglycerides: 92 mg/dL (ref <150)
VLDL: 18 mg/dL (ref <30)

## 2016-07-30 LAB — CBC WITH DIFFERENTIAL/PLATELET
BASOS PCT: 1 %
Basophils Absolute: 77 cells/uL (ref 0–200)
Eosinophils Absolute: 385 cells/uL (ref 15–500)
Eosinophils Relative: 5 %
HCT: 45 % (ref 38.5–50.0)
Hemoglobin: 14.9 g/dL (ref 13.0–17.0)
Lymphocytes Relative: 43 %
Lymphs Abs: 3311 cells/uL (ref 850–3900)
MCH: 29.3 pg (ref 27.0–33.0)
MCHC: 33.1 g/dL (ref 32.0–36.0)
MCV: 88.6 fL (ref 80.0–100.0)
MPV: 9.9 fL (ref 7.5–12.5)
Monocytes Absolute: 770 cells/uL (ref 200–950)
Monocytes Relative: 10 %
Neutro Abs: 3157 cells/uL (ref 1500–7800)
Neutrophils Relative %: 41 %
PLATELETS: 311 10*3/uL (ref 140–400)
RBC: 5.08 MIL/uL (ref 4.20–5.80)
RDW: 14.6 % (ref 11.0–15.0)
WBC: 7.7 10*3/uL (ref 3.8–10.8)

## 2016-07-31 LAB — PSA: PSA: 0.3 ng/mL (ref <=4.0)

## 2016-08-01 ENCOUNTER — Encounter: Payer: Self-pay | Admitting: Family Medicine

## 2016-08-01 ENCOUNTER — Ambulatory Visit (INDEPENDENT_AMBULATORY_CARE_PROVIDER_SITE_OTHER): Payer: Medicare Other | Admitting: Family Medicine

## 2016-08-01 ENCOUNTER — Encounter: Payer: Self-pay | Admitting: Gastroenterology

## 2016-08-01 VITALS — BP 104/68 | HR 64 | Temp 98.4°F | Resp 14 | Ht 69.0 in | Wt 172.0 lb

## 2016-08-01 DIAGNOSIS — R053 Chronic cough: Secondary | ICD-10-CM

## 2016-08-01 DIAGNOSIS — Z1211 Encounter for screening for malignant neoplasm of colon: Secondary | ICD-10-CM

## 2016-08-01 DIAGNOSIS — I999 Unspecified disorder of circulatory system: Secondary | ICD-10-CM | POA: Diagnosis not present

## 2016-08-01 DIAGNOSIS — E7800 Pure hypercholesterolemia, unspecified: Secondary | ICD-10-CM

## 2016-08-01 DIAGNOSIS — R05 Cough: Secondary | ICD-10-CM | POA: Diagnosis not present

## 2016-08-01 DIAGNOSIS — Z0000 Encounter for general adult medical examination without abnormal findings: Secondary | ICD-10-CM

## 2016-08-01 DIAGNOSIS — R0989 Other specified symptoms and signs involving the circulatory and respiratory systems: Secondary | ICD-10-CM

## 2016-08-01 NOTE — Progress Notes (Signed)
Subjective:     Patient ID: Christian [name], male   DOB: April 29, 1945, 71 y.o.   MRN: [id number]  HPI Patient is here today for complete physical exam. Over the last 3-4 months, the patient has developed a chronic cough. He does smoke. He denies any hemoptysis. He would like to get a chest x-ray. He also has developed what appears to be poor circulation in his legs. He has chronic venous insufficiency. However today on his exam, I am unable to appreciate a good pulse in the dorsalis pedis or the posterior tibialis on his left leg. He complains of both legs feel cold at night and will occasionally turn a purple discoloration.  He denies any claudication. His last colonoscopy was in 2014. He recommended repeating that in 3 years. He is due. His PSA was checked and his lab work and was excellent. He is due for hepatitis C screening but he declines that. His immunizations are up-to-date Immunization History  Administered Date(s) Administered  . Influenza, High Dose Seasonal PF 10/20/2013, 11/01/2015  . Influenza-Unspecified 10/06/2012  . Pneumococcal Conjugate-13 07/01/2013  . Pneumococcal Polysaccharide-23 05/18/2009, 07/30/2015  . Tdap 01/07/2011  . Zoster 06/26/2011    Past Medical History:  Diagnosis Date  . Adenomatous colon polyp   . Allergy    seasonal  . Hyperlipidemia   . Hypertriglyceridemia   . Smoker    Past Surgical History:  Procedure Laterality Date  . CHOLECYSTECTOMY  1987  . EYE SURGERY     lasik   Current Outpatient Prescriptions on File Prior to Visit  Medication Sig Dispense Refill  . cholecalciferol (VITAMIN D) 1000 UNITS tablet Take 1,000 Units by mouth daily.    . fenofibrate 160 MG tablet TAKE 1 TABLET EVERY DAY 90 tablet 1  . fluticasone (FLONASE) 50 MCG/ACT nasal spray PLACE 2 SPRAYS INTO BOTH NOSTRILS DAILY. 16 g 4  . Multiple Vitamins-Minerals (MULTIVITAMIN WITH MINERALS) tablet Take 1 tablet by mouth daily.    . pravastatin (PRAVACHOL) 40 MG tablet TAKE 1  TABLET BY MOUTH EVERY DAY 90 tablet 1  . vitamin C (ASCORBIC ACID) 500 MG tablet Take 500 mg by mouth daily.     No current facility-administered medications on file prior to visit.    No Known Allergies Social History   Social History  . Marital status: Married    Spouse name: N/A  . Number of children: N/A  . Years of education: N/A   Occupational History  . Not on file.   Social History Main Topics  . Smoking status: Current Every Day Smoker    Packs/day: 0.30    Types: Cigarettes  . Smokeless tobacco: Never Used  . Alcohol use 3.5 oz/week    7 drink(s) per week  . Drug use: No  . Sexual activity: Not on file   Other Topics Concern  . Not on file   Social History Narrative  . No narrative on file   Family History  Problem Relation Age of Onset  . Hypertension Mother   . Stroke Mother   . Diabetes Mother        borderline  . Arthritis Father   . Colon cancer Neg Hx   . Esophageal cancer Neg Hx   . Rectal cancer Neg Hx   . Stomach cancer Neg Hx      Review of Systems  All other systems reviewed and are negative.      Objective:   Physical Exam  Constitutional: He is oriented to person,  place, and time. He appears well-developed and well-nourished. No distress.  HENT:  Head: Normocephalic and atraumatic.  Right Ear: External ear normal.  Left Ear: External ear normal.  Nose: Nose normal.  Mouth/Throat: Oropharynx is clear and moist. No oropharyngeal exudate.  Eyes: Pupils are equal, round, and reactive to light. Conjunctivae and EOM are normal. Right eye exhibits no discharge. Left eye exhibits no discharge. No scleral icterus.  Neck: Normal range of motion. Neck supple. No JVD present. No tracheal deviation present. No thyromegaly present.  Cardiovascular: Normal rate, regular rhythm, normal heart sounds and intact distal pulses.  Exam reveals no gallop and no friction rub.   No murmur heard. Pulmonary/Chest: Effort normal and breath sounds normal. No  stridor. No respiratory distress. He has no wheezes. He has no rales. He exhibits no tenderness.  Abdominal: Soft. Bowel sounds are normal. He exhibits no distension and no mass. There is no tenderness. There is no rebound and no guarding.  Genitourinary: Rectum normal, prostate normal and penis normal.  Musculoskeletal: Normal range of motion. He exhibits no edema, tenderness or deformity.  Lymphadenopathy:    He has no cervical adenopathy.  Neurological: He is alert and oriented to person, place, and time. He has normal reflexes. No cranial nerve deficit. He exhibits normal muscle tone. Coordination normal.  Skin: Skin is warm. No rash noted. He is not diaphoretic. No erythema. No pallor.  Psychiatric: He has a normal mood and affect. His behavior is normal. Judgment and thought content normal.  Vitals reviewed.  Lab on 07/30/2016  Component Date Value Ref Range Status  . WBC 07/30/2016 7.7  3.8 - 10.8 K/uL Final  . RBC 07/30/2016 5.08  4.20 - 5.80 MIL/uL Final  . Hemoglobin 07/30/2016 14.9  13.0 - 17.0 g/dL Final  . HCT 07/30/2016 45.0  38.5 - 50.0 % Final  . MCV 07/30/2016 88.6  80.0 - 100.0 fL Final  . MCH 07/30/2016 29.3  27.0 - 33.0 pg Final  . MCHC 07/30/2016 33.1  32.0 - 36.0 g/dL Final  . RDW 07/30/2016 14.6  11.0 - 15.0 % Final  . Platelets 07/30/2016 311  140 - 400 K/uL Final  . MPV 07/30/2016 9.9  7.5 - 12.5 fL Final  . Neutro Abs 07/30/2016 3157  1,500 - 7,800 cells/uL Final  . Lymphs Abs 07/30/2016 3311  850 - 3,900 cells/uL Final  . Monocytes Absolute 07/30/2016 770  200 - 950 cells/uL Final  . Eosinophils Absolute 07/30/2016 385  15 - 500 cells/uL Final  . Basophils Absolute 07/30/2016 77  0 - 200 cells/uL Final  . Neutrophils Relative % 07/30/2016 41  % Final  . Lymphocytes Relative 07/30/2016 43  % Final  . Monocytes Relative 07/30/2016 10  % Final  . Eosinophils Relative 07/30/2016 5  % Final  . Basophils Relative 07/30/2016 1  % Final  . Smear Review 07/30/2016  Criteria for review not met   Final  . Sodium 07/30/2016 141  135 - 146 mmol/L Final  . Potassium 07/30/2016 5.4* 3.5 - 5.3 mmol/L Final  . Chloride 07/30/2016 107  98 - 110 mmol/L Final  . CO2 07/30/2016 21  20 - 31 mmol/L Final  . Glucose, Bld 07/30/2016 98  70 - 99 mg/dL Final  . BUN 07/30/2016 13  7 - 25 mg/dL Final  . Creat 07/30/2016 1.44* 0.70 - 1.18 mg/dL Final   Comment:   For patients > or = 72 years of age: The upper reference limit for Creatinine is  approximately 13% higher for people identified as African-American.     . Total Bilirubin 07/30/2016 0.5  0.2 - 1.2 mg/dL Final  . Alkaline Phosphatase 07/30/2016 71  40 - 115 U/L Final  . AST 07/30/2016 16  10 - 35 U/L Final  . ALT 07/30/2016 18  9 - 46 U/L Final  . Total Protein 07/30/2016 6.3  6.1 - 8.1 g/dL Final  . Albumin 07/30/2016 4.0  3.6 - 5.1 g/dL Final  . Calcium 07/30/2016 9.4  8.6 - 10.3 mg/dL Final  . Cholesterol 07/30/2016 162  <200 mg/dL Final  . Triglycerides 07/30/2016 92  <150 mg/dL Final  . HDL 07/30/2016 62  >40 mg/dL Final  . Total CHOL/HDL Ratio 07/30/2016 2.6  <5.0 Ratio Final  . VLDL 07/30/2016 18  <30 mg/dL Final  . LDL Cholesterol 07/30/2016 82  <100 mg/dL Final  . PSA 07/30/2016 0.3  <=4.0 ng/mL Final   Comment:   The total PSA value from this assay system is standardized against the WHO standard. The test result will be approximately 20% lower when compared to the equimolar-standardized total PSA ([employer]). Comparison of serial PSA results should be interpreted with this fact in mind.   This test was performed using the Siemens chemiluminescent method. Values obtained from different assay methods cannot be used interchangeably. PSA levels, regardless of value, should not be interpreted as absolute evidence of the presence or absence of disease.          Assessment:     Chronic cough - Plan: DG Chest 2 View  Poor circulation  Colon cancer screening  General medical  exam  Pure hypercholesterolemia      Plan:     Because of the chronic cough, I recommended the patient to the chest x-ray. Also recommended smoking cessation. He has faint expiratory wheezes in am concerned he is developing emphysema. Because of abnormalities on his exam, I'm going to schedule the patient for arterial Dopplers of the lower extremities to evaluate for peripheral vascular disease. I will also schedule the patient for colonoscopy. Cholesterol is excellent. Blood pressures well controlled. The remainder of his cancer screening is up-to-date. Immunizations are up-to-date.

## 2016-08-07 ENCOUNTER — Ambulatory Visit (HOSPITAL_COMMUNITY)
Admission: RE | Admit: 2016-08-07 | Discharge: 2016-08-07 | Disposition: A | Discharge status: Home / Self Care | Payer: Medicare Other | Source: Ambulatory Visit | Attending: Family Medicine | Admitting: Family Medicine

## 2016-08-07 DIAGNOSIS — R9439 Abnormal result of other cardiovascular function study: Secondary | ICD-10-CM | POA: Diagnosis not present

## 2016-08-07 DIAGNOSIS — I999 Unspecified disorder of circulatory system: Secondary | ICD-10-CM | POA: Diagnosis not present

## 2016-08-07 DIAGNOSIS — I739 Peripheral vascular disease, unspecified: Secondary | ICD-10-CM | POA: Insufficient documentation

## 2016-08-07 DIAGNOSIS — R0989 Other specified symptoms and signs involving the circulatory and respiratory systems: Secondary | ICD-10-CM

## 2016-08-07 NOTE — Progress Notes (Signed)
VASCULAR LAB PRELIMINARY  ARTERIAL  ABI completed:    RIGHT    LEFT    PRESSURE WAVEFORM  PRESSURE WAVEFORM  BRACHIAL 128 Triphasic BRACHIAL 130 Triphasic  DP 158 Triphasic DP 153 Triphasic  PT 159 Biphasic PT 131 Triphasic  GREAT TOE 61  NA GREAT TOE 75  NA    RIGHT LEFT  ABI / TBA 1.22 / 0.47 1.18 / 0.58   ABIs and Doppler waveforms indicate normal arterial flow bilaterally at rest. Great toe pressures are abnormal bilaterally.  Eusevio Schriver, RVS 08/07/2016, 10:18 AM

## 2016-08-08 ENCOUNTER — Encounter: Payer: Self-pay | Admitting: Family Medicine

## 2016-08-13 ENCOUNTER — Other Ambulatory Visit: Payer: Self-pay | Admitting: Family Medicine

## 2016-08-13 MED ORDER — ATORVASTATIN CALCIUM 40 MG PO TABS: 40.0000 mg | ORAL_TABLET | Freq: Every day | ORAL | 3 refills | Status: DC

## 2016-09-01 ENCOUNTER — Other Ambulatory Visit: Payer: Self-pay | Admitting: Family Medicine

## 2016-09-22 ENCOUNTER — Ambulatory Visit (AMBULATORY_SURGERY_CENTER): Payer: Self-pay | Admitting: *Deleted

## 2016-09-22 VITALS — Ht 69.0 in | Wt 178.0 lb

## 2016-09-22 DIAGNOSIS — Z86010 Personal history of colonic polyps: Secondary | ICD-10-CM

## 2016-09-22 MED ORDER — NA SULFATE-K SULFATE-MG SULF 17.5-3.13-1.6 GM/177ML PO SOLN: ORAL | 0 refills | Status: DC

## 2016-09-22 NOTE — Progress Notes (Signed)
Patient denies any allergies to eggs or soy. Patient denies any problems with anesthesia/sedation. Patient denies any oxygen use at home and does not take any diet/weight loss medications. EMMI education assisgned to patient on colonoscopy, this was explained and instructions given to patient. 

## 2016-09-24 ENCOUNTER — Encounter: Payer: Self-pay | Admitting: Gastroenterology

## 2016-09-29 DIAGNOSIS — H40013 Open angle with borderline findings, low risk, bilateral: Secondary | ICD-10-CM | POA: Diagnosis not present

## 2016-10-06 ENCOUNTER — Ambulatory Visit (AMBULATORY_SURGERY_CENTER): Payer: Medicare Other | Admitting: Gastroenterology

## 2016-10-06 ENCOUNTER — Encounter: Payer: Self-pay | Admitting: Gastroenterology

## 2016-10-06 VITALS — BP 116/74 | HR 67 | Temp 98.2°F | Resp 12 | Ht 69.0 in | Wt 172.0 lb

## 2016-10-06 DIAGNOSIS — Z86010 Personal history of colonic polyps: Secondary | ICD-10-CM

## 2016-10-06 DIAGNOSIS — D123 Benign neoplasm of transverse colon: Secondary | ICD-10-CM

## 2016-10-06 MED ORDER — SODIUM CHLORIDE 0.9 % IV SOLN: 500.0000 mL | INTRAVENOUS | Status: DC

## 2016-10-06 NOTE — Patient Instructions (Signed)
Discharge instructions given. Handouts on polyps,diverticulosis and hemorrhoids. Resume previous medications. YOU HAD AN ENDOSCOPIC PROCEDURE TODAY AT THE [facility]:   Refer to the procedure report that was given to you for any specific questions about what was found during the examination.  If the procedure report does not answer your questions, please call your gastroenterologist to clarify.  If you requested that your care partner not be given the details of your procedure findings, then the procedure report has been included in a sealed envelope for you to review at your convenience later.  YOU SHOULD EXPECT: Some feelings of bloating in the abdomen. Passage of more gas than usual.  Walking can help get rid of the air that was put into your GI tract during the procedure and reduce the bloating. If you had a lower endoscopy (such as a colonoscopy or flexible sigmoidoscopy) you may notice spotting of blood in your stool or on the toilet paper. If you underwent a bowel prep for your procedure, you may not have a normal bowel movement for a few days.  Please Note:  You might notice some irritation and congestion in your nose or some drainage.  This is from the oxygen used during your procedure.  There is no need for concern and it should clear up in a day or so.  SYMPTOMS TO REPORT IMMEDIATELY:   Following lower endoscopy (colonoscopy or flexible sigmoidoscopy):  Excessive amounts of blood in the stool  Significant tenderness or worsening of abdominal pains  Swelling of the abdomen that is new, acute  Fever of 100F or higher   For urgent or emergent issues, a gastroenterologist can be reached at any hour by calling [phone].   DIET:  We do recommend a small meal at first, but then you may proceed to your regular diet.  Drink plenty of fluids but you should avoid alcoholic beverages for 24 hours.  ACTIVITY:  You should plan to take it easy for the rest of today and you  should NOT DRIVE or use heavy machinery until tomorrow (because of the sedation medicines used during the test).    FOLLOW UP: Our staff will call the number listed on your records the next business day following your procedure to check on you and address any questions or concerns that you may have regarding the information given to you following your procedure. If we do not reach you, we will leave a message.  However, if you are feeling well and you are not experiencing any problems, there is no need to return our call.  We will assume that you have returned to your regular daily activities without incident.  If any biopsies were taken you will be contacted by phone or by letter within the next 1-3 weeks.  Please call us at [phone] if you have not heard about the biopsies in 3 weeks.    SIGNATURES/CONFIDENTIALITY: You and/or your care partner have signed paperwork which will be entered into your electronic medical record.  These signatures attest to the fact that that the information above on your After Visit Summary has been reviewed and is understood.  Full responsibility of the confidentiality of this discharge information lies with you and/or your care-partner. 

## 2016-10-06 NOTE — Progress Notes (Signed)
Called to room to assist during endoscopic procedure.  Patient ID and intended procedure confirmed with present staff. Received instructions for my participation in the procedure from the performing physician.  

## 2016-10-06 NOTE — Progress Notes (Signed)
Report given to PACU, vss 

## 2016-10-06 NOTE — Op Note (Signed)
[facility] Patient Name: Christian [name] Procedure Date: 10/06/2016 9:54 AM MRN: [id number] Endoscopist: Mallie Mussel L. Loletha Carrow , MD Age: 71 Referring MD:  Date of Birth: [birth year] Gender: Male Account #: [id number] Procedure:                Colonoscopy Indications:              Surveillance: Personal history of adenomatous                            polyps on last colonoscopy > 3 years ago Medicines:                Monitored Anesthesia Care Procedure:                Pre-Anesthesia Assessment:                           - Prior to the procedure, a History and Physical                            was performed, and patient medications and                            allergies were reviewed. The patient's tolerance of                            previous anesthesia was also reviewed. The risks                            and benefits of the procedure and the sedation                            options and risks were discussed with the patient.                            All questions were answered, and informed consent                            was obtained. Prior Anticoagulants: The patient has                            taken no previous anticoagulant or antiplatelet                            agents. ASA Grade Assessment: I - A normal, healthy                            patient. After reviewing the risks and benefits,                            the patient was deemed in satisfactory condition to                            undergo the procedure.  After obtaining informed consent, the colonoscope                            was passed under direct vision. Throughout the                            procedure, the patient's blood pressure, pulse, and                            oxygen saturations were monitored continuously. The                            Colonoscope was introduced through the anus and                            advanced to the the cecum, identified  by                            appendiceal orifice and ileocecal valve. The                            colonoscopy was performed without difficulty. The                            patient tolerated the procedure well. The quality                            of the bowel preparation was good. The ileocecal                            valve, appendiceal orifice, and rectum were                            photographed. The quality of the bowel preparation                            was evaluated using the BBPS [facility] Bowel                            Preparation Scale) with scores of: Right Colon = 2,                            Transverse Colon = 2 and Left Colon = 2. The total                            BBPS score equals 6. The bowel preparation used was                            SUPREP. Scope In: 10:07:51 AM Scope Out: 10:21:33 AM Scope Withdrawal Time: 0 hours 10 minutes 24 seconds  Total Procedure Duration: 0 hours 13 minutes 42 seconds  Findings:                 The perianal and digital rectal  examinations were                            normal.                           Diverticula were found in the entire colon.                           A 4 mm polyp was found in the mid transverse colon.                            The polyp was sessile. The polyp was removed with a                            cold snare. Resection and retrieval were complete.                           Internal hemorrhoids were found. The hemorrhoids                            were Grade I (internal hemorrhoids that do not                            prolapse).                           The exam was otherwise without abnormality on                            direct and retroflexion views. Complications:            No immediate complications. Estimated Blood Loss:     Estimated blood loss: none. Impression:               - Diverticulosis in the entire examined colon.                           - One 4 mm polyp in the mid  transverse colon,                            removed with a cold snare. Resected and retrieved.                           - Internal hemorrhoids.                           - The examination was otherwise normal on direct                            and retroflexion views. Recommendation:           - Patient has a contact number available for                            emergencies. The signs and symptoms of potential  delayed complications were discussed with the                            patient. Return to normal activities tomorrow.                            Written discharge instructions were provided to the                            patient.                           - Resume previous diet.                           - Continue present medications.                           - Await pathology results.                           - Repeat colonoscopy is recommended for                            surveillance. The colonoscopy date will be                            determined after pathology results from today's                            exam become available for review. Ashe Gago L. Loletha Carrow, MD 10/06/2016 10:27:49 AM This report has been signed electronically.

## 2016-10-06 NOTE — Progress Notes (Signed)
Pt's states no medical or surgical changes since previsit or office visit. 

## 2016-10-07 ENCOUNTER — Telehealth: Payer: Self-pay | Admitting: *Deleted

## 2016-10-07 NOTE — Telephone Encounter (Signed)
  Follow up Call-  Call back number 10/06/2016  Post procedure Call Back phone  # [phone]  Permission to leave phone message Yes  Some recent data might be hidden     Patient questions:  Do you have a fever, pain , or abdominal swelling? No. Pain Score  0 *  Have you tolerated food without any problems? Yes.    Have you been able to return to your normal activities? Yes.    Do you have any questions about your discharge instructions: Diet   No. Medications  No. Follow up visit  No.  Do you have questions or concerns about your Care? No.  Actions: * If pain score is 4 or above: No action needed, pain <4.

## 2016-10-07 NOTE — Telephone Encounter (Signed)
No answer, left message to call if questions or concerns. 

## 2016-10-13 ENCOUNTER — Encounter: Payer: Self-pay | Admitting: Gastroenterology

## 2017-01-23 ENCOUNTER — Other Ambulatory Visit: Payer: Self-pay | Admitting: Family Medicine

## 2017-03-02 ENCOUNTER — Other Ambulatory Visit: Payer: Self-pay | Admitting: *Deleted

## 2017-03-02 MED ORDER — FENOFIBRATE 160 MG PO TABS: 160.0000 mg | ORAL_TABLET | Freq: Every day | ORAL | 1 refills | Status: DC

## 2017-07-30 ENCOUNTER — Other Ambulatory Visit: Payer: Medicare Other

## 2017-07-30 ENCOUNTER — Other Ambulatory Visit: Payer: Self-pay | Admitting: Family Medicine

## 2017-07-30 DIAGNOSIS — Z0000 Encounter for general adult medical examination without abnormal findings: Secondary | ICD-10-CM

## 2017-07-30 DIAGNOSIS — E785 Hyperlipidemia, unspecified: Secondary | ICD-10-CM | POA: Diagnosis not present

## 2017-07-30 DIAGNOSIS — F17200 Nicotine dependence, unspecified, uncomplicated: Secondary | ICD-10-CM

## 2017-07-30 DIAGNOSIS — Z125 Encounter for screening for malignant neoplasm of prostate: Secondary | ICD-10-CM | POA: Diagnosis not present

## 2017-07-30 LAB — PSA: PSA: 0.4 ng/mL (ref <=4.0)

## 2017-07-30 LAB — COMPREHENSIVE METABOLIC PANEL
AG Ratio: 2.1 (calc) (ref 1.0–2.5)
ALT: 20 U/L (ref 9–46)
AST: 18 U/L (ref 10–35)
Albumin: 4.1 g/dL (ref 3.6–5.1)
Alkaline phosphatase (APISO): 72 U/L (ref 40–115)
BUN/Creatinine Ratio: 11 (calc) (ref 6–22)
BUN: 16 mg/dL (ref 7–25)
CHLORIDE: 108 mmol/L (ref 98–110)
CO2: 25 mmol/L (ref 20–32)
CREATININE: 1.44 mg/dL — AB (ref 0.70–1.18)
Calcium: 9.3 mg/dL (ref 8.6–10.3)
GLOBULIN: 2 g/dL (ref 1.9–3.7)
Glucose, Bld: 106 mg/dL — ABNORMAL HIGH (ref 65–99)
Potassium: 4.4 mmol/L (ref 3.5–5.3)
Sodium: 141 mmol/L (ref 135–146)
Total Bilirubin: 0.5 mg/dL (ref 0.2–1.2)
Total Protein: 6.1 g/dL (ref 6.1–8.1)

## 2017-07-30 LAB — CBC WITH DIFFERENTIAL/PLATELET
BASOS ABS: 109 {cells}/uL (ref 0–200)
Basophils Relative: 1.3 %
Eosinophils Absolute: 479 cells/uL (ref 15–500)
Eosinophils Relative: 5.7 %
HEMATOCRIT: 42.7 % (ref 38.5–50.0)
Hemoglobin: 14.4 g/dL (ref 13.2–17.1)
Lymphs Abs: 3830 cells/uL (ref 850–3900)
MCH: 29.1 pg (ref 27.0–33.0)
MCHC: 33.7 g/dL (ref 32.0–36.0)
MCV: 86.4 fL (ref 80.0–100.0)
MPV: 10.2 fL (ref 7.5–12.5)
Monocytes Relative: 9.4 %
NEUTROS ABS: 3192 {cells}/uL (ref 1500–7800)
NEUTROS PCT: 38 %
Platelets: 313 10*3/uL (ref 140–400)
RBC: 4.94 10*6/uL (ref 4.20–5.80)
RDW: 13.1 % (ref 11.0–15.0)
Total Lymphocyte: 45.6 %
WBC: 8.4 10*3/uL (ref 3.8–10.8)
WBCMIX: 790 {cells}/uL (ref 200–950)

## 2017-07-30 LAB — LIPID PANEL
CHOL/HDL RATIO: 2.4 (calc) (ref <5.0)
Cholesterol: 127 mg/dL (ref <200)
HDL: 54 mg/dL (ref >40)
LDL CHOLESTEROL (CALC): 57 mg/dL
NON-HDL CHOLESTEROL (CALC): 73 mg/dL (ref <130)
TRIGLYCERIDES: 76 mg/dL (ref <150)

## 2017-08-03 ENCOUNTER — Ambulatory Visit (INDEPENDENT_AMBULATORY_CARE_PROVIDER_SITE_OTHER): Payer: Medicare Other | Admitting: Family Medicine

## 2017-08-03 ENCOUNTER — Encounter: Payer: Self-pay | Admitting: Family Medicine

## 2017-08-03 VITALS — BP 126/74 | HR 76 | Temp 98.1°F | Resp 16 | Ht 69.0 in | Wt 170.0 lb

## 2017-08-03 DIAGNOSIS — N183 Chronic kidney disease, stage 3 unspecified: Secondary | ICD-10-CM | POA: Insufficient documentation

## 2017-08-03 DIAGNOSIS — Z0000 Encounter for general adult medical examination without abnormal findings: Secondary | ICD-10-CM | POA: Diagnosis not present

## 2017-08-03 DIAGNOSIS — E7800 Pure hypercholesterolemia, unspecified: Secondary | ICD-10-CM | POA: Diagnosis not present

## 2017-08-03 DIAGNOSIS — F17200 Nicotine dependence, unspecified, uncomplicated: Secondary | ICD-10-CM | POA: Diagnosis not present

## 2017-08-03 MED ORDER — ALPRAZOLAM 0.5 MG PO TABS: 0.5000 mg | ORAL_TABLET | Freq: Every evening | ORAL | 0 refills | Status: DC | PRN

## 2017-08-03 NOTE — Progress Notes (Signed)
Subjective:     Patient ID: Christian [name], male   DOB: Aug 18, 1945, 72 y.o.   MRN: [id number]  HPI Patient is here today for complete physical exam.  After his last visit, we performed arterial Dopplers of the lower extremities which showed normal ABIs but decreased TBI's suggesting issues with microvascular circulation.  I have encouraged the patient to take an aspirin which he is.  He is currently on a statin and his cholesterol is well controlled.  Unfortunately he continues to smoke.  I continue to encourage him to quit smoking.  He is due for Shingrix.  He is due for a flu shot this fall.  I have encouraged the patient to receive both of these.  He is due for hepatitis C screening.  His colonoscopy is up-to-date.  He had this last year and is not due again for at least 5 years due to the presence of a tubular adenoma.  His PSA was normal on his recent lab work suggesting no presence of prostate cancer.  Unfortunately his son has been recently diagnosed with ALS.  He is helping to care for his son.  This causes him to have nights where he is unable to sleep.  Occasionally he feels hopeless as he realizes that his signs dying from this condition.  Sometimes he is unable to turn his thoughts off at night and this keeps him awake.  He would like something that he could take occasionally to help calm him down and help him get some sleep.  We had a discussion today about Xanax and he would like to have a few on hand to help him sleep on bad nights.  He denies any depression. Immunization History  Administered Date(s) Administered  . Influenza, High Dose Seasonal PF 10/20/2013, 11/01/2015, 09/29/2016  . Influenza-Unspecified 10/06/2012  . Pneumococcal Conjugate-13 07/01/2013  . Pneumococcal Polysaccharide-23 05/18/2009, 07/30/2015  . Tdap 01/07/2011  . Zoster 06/26/2011    Past Medical History:  Diagnosis Date  . Adenomatous colon polyp   . Allergy    seasonal  . Hyperlipidemia   .  Hypertriglyceridemia   . PVD (peripheral vascular disease) (HCC)    decreased TBI bilaterally  . Smoker    Past Surgical History:  Procedure Laterality Date  . CHOLECYSTECTOMY  1987  . EYE SURGERY     lasik   Current Outpatient Medications on File Prior to Visit  Medication Sig Dispense Refill  . aspirin EC 81 MG tablet Take 81 mg by mouth daily.    Marland Kitchen atorvastatin (LIPITOR) 40 MG tablet TAKE 1 TABLET BY MOUTH EVERY DAY 90 tablet 3  . cholecalciferol (VITAMIN D) 1000 UNITS tablet Take 1,000 Units by mouth daily.    . fenofibrate 160 MG tablet Take 1 tablet (160 mg total) by mouth daily. 90 tablet 1  . fluticasone (FLONASE) 50 MCG/ACT nasal spray INSTILL 2 SPRAYS INTO EACH NOSTRILS DAILY 48 g 2  . loratadine (CLARITIN) 10 MG tablet Take 10 mg by mouth daily.    . Multiple Vitamins-Minerals (MULTIVITAMIN WITH MINERALS) tablet Take 1 tablet by mouth daily.    . vitamin C (ASCORBIC ACID) 500 MG tablet Take 500 mg by mouth daily.     Current Facility-Administered Medications on File Prior to Visit  Medication Dose Route Frequency Provider Last Rate Last Dose  . 0.9 %  sodium chloride infusion  500 mL Intravenous Continuous Doran Stabler, MD       No Known Allergies Social History   Socioeconomic  History  . Marital status: Married    Spouse name: Not on file  . Number of children: Not on file  . Years of education: Not on file  . Highest education level: Not on file  Occupational History  . Not on file  Social Needs  . Financial resource strain: Not on file  . Food insecurity:    Worry: Not on file    Inability: Not on file  . Transportation needs:    Medical: Not on file    Non-medical: Not on file  Tobacco Use  . Smoking status: Current Every Day Smoker    Packs/day: 0.25    Types: Cigarettes  . Smokeless tobacco: Never Used  Substance and Sexual Activity  . Alcohol use: Yes    Alcohol/week: 8.4 oz    Types: 7 Standard drinks or equivalent, 7 Shots of liquor per  week    Comment: 1 drink per day per pt  . Drug use: No  . Sexual activity: Not on file  Lifestyle  . Physical activity:    Days per week: Not on file    Minutes per session: Not on file  . Stress: Not on file  Relationships  . Social connections:    Talks on phone: Not on file    Gets together: Not on file    Attends religious service: Not on file    Active member of club or organization: Not on file    Attends meetings of clubs or organizations: Not on file    Relationship status: Not on file  . Intimate partner violence:    Fear of current or ex partner: Not on file    Emotionally abused: Not on file    Physically abused: Not on file    Forced sexual activity: Not on file  Other Topics Concern  . Not on file  Social History Narrative  . Not on file   Family History  Problem Relation Age of Onset  . Hypertension Mother   . Stroke Mother   . Diabetes Mother        borderline  . Arthritis Father   . ALS Son   . Colon cancer Neg Hx   . Esophageal cancer Neg Hx   . Rectal cancer Neg Hx   . Stomach cancer Neg Hx      Review of Systems  All other systems reviewed and are negative.      Objective:   Physical Exam  Constitutional: He is oriented to person, place, and time. He appears well-developed and well-nourished. No distress.  HENT:  Head: Normocephalic and atraumatic.  Right Ear: External ear normal.  Left Ear: External ear normal.  Nose: Nose normal.  Mouth/Throat: Oropharynx is clear and moist. No oropharyngeal exudate.  Eyes: Pupils are equal, round, and reactive to light. Conjunctivae and EOM are normal. Right eye exhibits no discharge. Left eye exhibits no discharge. No scleral icterus.  Neck: Normal range of motion. Neck supple. No JVD present. No tracheal deviation present. No thyromegaly present.  Cardiovascular: Normal rate, regular rhythm, normal heart sounds and intact distal pulses. Exam reveals no gallop and no friction rub.  No murmur  heard. Pulmonary/Chest: Effort normal and breath sounds normal. No stridor. No respiratory distress. He has no wheezes. He has no rales. He exhibits no tenderness.  Abdominal: Soft. Bowel sounds are normal. He exhibits no distension and no mass. There is no tenderness. There is no rebound and no guarding.  Genitourinary: Rectum normal, prostate normal  and penis normal.  Musculoskeletal: Normal range of motion. He exhibits no edema, tenderness or deformity.  Lymphadenopathy:    He has no cervical adenopathy.  Neurological: He is alert and oriented to person, place, and time. He has normal reflexes. No cranial nerve deficit. He exhibits normal muscle tone. Coordination normal.  Skin: Skin is warm. No rash noted. He is not diaphoretic. No erythema. No pallor.  Psychiatric: He has a normal mood and affect. His behavior is normal. Judgment and thought content normal.  Vitals reviewed.  Lab on 07/30/2017  Component Date Value Ref Range Status  . PSA 07/30/2017 0.4  < OR = 4.0 ng/mL Final   Comment: The total PSA value from this assay system is  standardized against the WHO standard. The test  result will be approximately 20% lower when compared  to the equimolar-standardized total PSA (Beckman  Coulter). Comparison of serial PSA results should be  interpreted with this fact in mind. . This test was performed using the Siemens  chemiluminescent method. Values obtained from  different assay methods cannot be used interchangeably. PSA levels, regardless of value, should not be interpreted as absolute evidence of the presence or absence of disease.   . Cholesterol 07/30/2017 127  <200 mg/dL Final  . HDL 07/30/2017 54  >40 mg/dL Final  . Triglycerides 07/30/2017 76  <150 mg/dL Final  . LDL Cholesterol (Calc) 07/30/2017 57  mg/dL (calc) Final   Comment: Reference range: <100 . Desirable range <100 mg/dL for primary prevention;   <70 mg/dL for patients with CHD or diabetic patients  with > or  = 2 CHD risk factors. Marland Kitchen LDL-C is now calculated using the Martin-Hopkins  calculation, which is a validated novel method providing  better accuracy than the Friedewald equation in the  estimation of LDL-C.  Cresenciano Genre et al. Annamaria Helling. 8101;751(02): 2061-2068  (http://education.[facility].com/faq/FAQ164)   . Total CHOL/HDL Ratio 07/30/2017 2.4  <5.0 (calc) Final  . Non-HDL Cholesterol (Calc) 07/30/2017 73  <130 mg/dL (calc) Final   Comment: For patients with diabetes plus 1 major ASCVD risk  factor, treating to a non-HDL-C goal of <100 mg/dL  (LDL-C of <70 mg/dL) is considered a therapeutic  option.   . Glucose, Bld 07/30/2017 106* 65 - 99 mg/dL Final   Comment: .            Fasting reference interval . For someone without known diabetes, a glucose value between 100 and 125 mg/dL is consistent with prediabetes and should be confirmed with a follow-up test. .   . BUN 07/30/2017 16  7 - 25 mg/dL Final  . Creat 07/30/2017 1.44* 0.70 - 1.18 mg/dL Final   Comment: For patients >60 years of age, the reference limit for Creatinine is approximately 13% higher for people identified as African-American. .   Havery Moros Ratio 07/30/2017 11  6 - 22 (calc) Final  . Sodium 07/30/2017 141  135 - 146 mmol/L Final  . Potassium 07/30/2017 4.4  3.5 - 5.3 mmol/L Final  . Chloride 07/30/2017 108  98 - 110 mmol/L Final  . CO2 07/30/2017 25  20 - 32 mmol/L Final  . Calcium 07/30/2017 9.3  8.6 - 10.3 mg/dL Final  . Total Protein 07/30/2017 6.1  6.1 - 8.1 g/dL Final  . Albumin 07/30/2017 4.1  3.6 - 5.1 g/dL Final  . Globulin 07/30/2017 2.0  1.9 - 3.7 g/dL (calc) Final  . AG Ratio 07/30/2017 2.1  1.0 - 2.5 (calc) Final  . Total Bilirubin 07/30/2017  0.5  0.2 - 1.2 mg/dL Final  . Alkaline phosphatase (APISO) 07/30/2017 72  40 - 115 U/L Final  . AST 07/30/2017 18  10 - 35 U/L Final  . ALT 07/30/2017 20  9 - 46 U/L Final  . WBC 07/30/2017 8.4  3.8 - 10.8 Thousand/uL Final  . RBC 07/30/2017  4.94  4.20 - 5.80 Million/uL Final  . Hemoglobin 07/30/2017 14.4  13.2 - 17.1 g/dL Final  . HCT 07/30/2017 42.7  38.5 - 50.0 % Final  . MCV 07/30/2017 86.4  80.0 - 100.0 fL Final  . MCH 07/30/2017 29.1  27.0 - 33.0 pg Final  . MCHC 07/30/2017 33.7  32.0 - 36.0 g/dL Final  . RDW 07/30/2017 13.1  11.0 - 15.0 % Final  . Platelets 07/30/2017 313  140 - 400 Thousand/uL Final  . MPV 07/30/2017 10.2  7.5 - 12.5 fL Final  . Neutro Abs 07/30/2017 3,192  1,500 - 7,800 cells/uL Final  . Lymphs Abs 07/30/2017 3,830  850 - 3,900 cells/uL Final  . WBC mixed population 07/30/2017 790  200 - 950 cells/uL Final  . Eosinophils Absolute 07/30/2017 479  15 - 500 cells/uL Final  . Basophils Absolute 07/30/2017 109  0 - 200 cells/uL Final  . Neutrophils Relative % 07/30/2017 38  % Final  . Total Lymphocyte 07/30/2017 45.6  % Final  . Monocytes Relative 07/30/2017 9.4  % Final  . Eosinophils Relative 07/30/2017 5.7  % Final  . Basophils Relative 07/30/2017 1.3  % Final       Assessment:     General medical exam  Pure hypercholesterolemia  Smoker      Plan:     My heart goes out to the patient.  His son is battling ALS.  I will give him Xanax 0.5 mg tablets.  He can take 1 at night as needed for insomnia.  We discussed the risk of habituation and abuse if used frequently.  I am very happy with his lab work.  He does have some mild chronic kidney disease but this is stable.  I recommended that he drink plenty of fluids and avoid NSAIDs.  Colonoscopy is up-to-date.  Prostate cancer screening is up-to-date.  Recommended a flu shot this fall.  Recommended shin Grix if it is reasonably priced.  Strongly encourage smoking cessation.

## 2017-08-28 ENCOUNTER — Other Ambulatory Visit: Payer: Self-pay | Admitting: Family Medicine

## 2017-09-10 DIAGNOSIS — H40013 Open angle with borderline findings, low risk, bilateral: Secondary | ICD-10-CM | POA: Diagnosis not present

## 2017-11-11 ENCOUNTER — Other Ambulatory Visit: Payer: Self-pay | Admitting: Family Medicine

## 2018-03-05 ENCOUNTER — Other Ambulatory Visit: Payer: Self-pay | Admitting: *Deleted

## 2018-03-05 MED ORDER — FENOFIBRATE 160 MG PO TABS: 160.0000 mg | ORAL_TABLET | Freq: Every day | ORAL | 1 refills | Status: DC

## 2018-07-20 ENCOUNTER — Other Ambulatory Visit: Payer: Self-pay

## 2018-07-20 ENCOUNTER — Other Ambulatory Visit: Payer: Medicare Other

## 2018-07-20 DIAGNOSIS — Z20822 Contact with and (suspected) exposure to covid-19: Secondary | ICD-10-CM

## 2018-07-20 DIAGNOSIS — R6889 Other general symptoms and signs: Secondary | ICD-10-CM | POA: Diagnosis not present

## 2018-07-25 LAB — NOVEL CORONAVIRUS, NAA: SARS-CoV-2, NAA: NOT DETECTED

## 2018-08-06 ENCOUNTER — Other Ambulatory Visit: Payer: Self-pay

## 2018-08-06 ENCOUNTER — Other Ambulatory Visit: Payer: Medicare Other

## 2018-08-06 DIAGNOSIS — Z0000 Encounter for general adult medical examination without abnormal findings: Secondary | ICD-10-CM

## 2018-08-06 DIAGNOSIS — N183 Chronic kidney disease, stage 3 (moderate): Secondary | ICD-10-CM | POA: Diagnosis not present

## 2018-08-06 DIAGNOSIS — E785 Hyperlipidemia, unspecified: Secondary | ICD-10-CM | POA: Diagnosis not present

## 2018-08-06 DIAGNOSIS — Z13220 Encounter for screening for lipoid disorders: Secondary | ICD-10-CM | POA: Diagnosis not present

## 2018-08-07 LAB — LIPID PANEL
Cholesterol: 120 mg/dL (ref <200)
HDL: 54 mg/dL (ref >=40)
LDL Cholesterol (Calc): 50 mg/dL (calc)
Non-HDL Cholesterol (Calc): 66 mg/dL (calc) (ref <130)
Total CHOL/HDL Ratio: 2.2 (calc) (ref <5.0)
Triglycerides: 78 mg/dL (ref <150)

## 2018-08-07 LAB — COMPREHENSIVE METABOLIC PANEL
AG Ratio: 2 (calc) (ref 1.0–2.5)
ALT: 18 U/L (ref 9–46)
AST: 21 U/L (ref 10–35)
Albumin: 3.9 g/dL (ref 3.6–5.1)
Alkaline phosphatase (APISO): 68 U/L (ref 35–144)
BUN/Creatinine Ratio: 14 (calc) (ref 6–22)
BUN: 23 mg/dL (ref 7–25)
CO2: 23 mmol/L (ref 20–32)
Calcium: 9.3 mg/dL (ref 8.6–10.3)
Chloride: 110 mmol/L (ref 98–110)
Creat: 1.61 mg/dL — ABNORMAL HIGH (ref 0.70–1.18)
Globulin: 2 g/dL (calc) (ref 1.9–3.7)
Glucose, Bld: 104 mg/dL — ABNORMAL HIGH (ref 65–99)
Potassium: 4.5 mmol/L (ref 3.5–5.3)
Sodium: 142 mmol/L (ref 135–146)
Total Bilirubin: 0.5 mg/dL (ref 0.2–1.2)
Total Protein: 5.9 g/dL — ABNORMAL LOW (ref 6.1–8.1)

## 2018-08-07 LAB — CBC WITH DIFFERENTIAL/PLATELET
Absolute Monocytes: 764 cells/uL (ref 200–950)
Basophils Absolute: 138 cells/uL (ref 0–200)
Basophils Relative: 1.5 %
Eosinophils Absolute: 515 cells/uL — ABNORMAL HIGH (ref 15–500)
Eosinophils Relative: 5.6 %
HCT: 43.1 % (ref 38.5–50.0)
Hemoglobin: 14.2 g/dL (ref 13.2–17.1)
Lymphs Abs: 4103 cells/uL — ABNORMAL HIGH (ref 850–3900)
MCH: 30 pg (ref 27.0–33.0)
MCHC: 32.9 g/dL (ref 32.0–36.0)
MCV: 90.9 fL (ref 80.0–100.0)
MPV: 10.4 fL (ref 7.5–12.5)
Monocytes Relative: 8.3 %
Neutro Abs: 3680 cells/uL (ref 1500–7800)
Neutrophils Relative %: 40 %
Platelets: 306 10*3/uL (ref 140–400)
RBC: 4.74 10*6/uL (ref 4.20–5.80)
RDW: 13.1 % (ref 11.0–15.0)
Total Lymphocyte: 44.6 %
WBC: 9.2 10*3/uL (ref 3.8–10.8)

## 2018-08-07 LAB — PSA: PSA: 0.3 ng/mL (ref <=4.0)

## 2018-08-09 ENCOUNTER — Other Ambulatory Visit: Payer: Self-pay

## 2018-08-09 ENCOUNTER — Ambulatory Visit (INDEPENDENT_AMBULATORY_CARE_PROVIDER_SITE_OTHER): Payer: Medicare Other | Admitting: Family Medicine

## 2018-08-09 ENCOUNTER — Encounter: Payer: Self-pay | Admitting: Family Medicine

## 2018-08-09 VITALS — BP 128/72 | HR 68 | Temp 97.8°F | Resp 14 | Ht 69.0 in | Wt 170.0 lb

## 2018-08-09 DIAGNOSIS — E781 Pure hyperglyceridemia: Secondary | ICD-10-CM

## 2018-08-09 DIAGNOSIS — E7800 Pure hypercholesterolemia, unspecified: Secondary | ICD-10-CM | POA: Diagnosis not present

## 2018-08-09 DIAGNOSIS — N183 Chronic kidney disease, stage 3 unspecified: Secondary | ICD-10-CM

## 2018-08-09 DIAGNOSIS — Z0001 Encounter for general adult medical examination with abnormal findings: Secondary | ICD-10-CM

## 2018-08-09 DIAGNOSIS — Z0000 Encounter for general adult medical examination without abnormal findings: Secondary | ICD-10-CM

## 2018-08-09 DIAGNOSIS — F17200 Nicotine dependence, unspecified, uncomplicated: Secondary | ICD-10-CM | POA: Diagnosis not present

## 2018-08-09 MED ORDER — DIAZEPAM 5 MG PO TABS: 5.0000 mg | ORAL_TABLET | Freq: Two times a day (BID) | ORAL | 1 refills | Status: DC | PRN

## 2018-08-09 NOTE — Progress Notes (Signed)
Subjective:     Patient ID: Christian [name], male   DOB: 07-13-1945, 73 y.o.   MRN: [id number]  HPI  07/2017 Patient is here today for complete physical exam.  After his last visit, we performed arterial Dopplers of the lower extremities which showed normal ABIs but decreased TBI's suggesting issues with microvascular circulation.  I have encouraged the patient to take an aspirin which he is.  He is currently on a statin and his cholesterol is well controlled.  Unfortunately he continues to smoke.  I continue to encourage him to quit smoking.  He is due for Shingrix.  He is due for a flu shot this fall.  I have encouraged the patient to receive both of these.  He is due for hepatitis C screening.  His colonoscopy is up-to-date.  He had this last year and is not due again for at least 5 years due to the presence of a tubular adenoma.  His PSA was normal on his recent lab work suggesting no presence of prostate cancer.  Unfortunately his son has been recently diagnosed with ALS.  He is helping to care for his son.  This causes him to have nights where he is unable to sleep.  Occasionally he feels hopeless as he realizes that his signs dying from this condition.  Sometimes he is unable to turn his thoughts off at night and this keeps him awake.  He would like something that he could take occasionally to help calm him down and help him get some sleep.  We had a discussion today about Xanax and he would like to have a few on hand to help him sleep on bad nights.  He denies any depression.  At that time, my plan was: My heart goes out to the patient.  His son is battling ALS.  I will give him Xanax 0.5 mg tablets.  He can take 1 at night as needed for insomnia.  We discussed the risk of habituation and abuse if used frequently.  I am very happy with his lab work.  He does have some mild chronic kidney disease but this is stable.  I recommended that he drink plenty of fluids and avoid NSAIDs.  Colonoscopy is up-to-date.   Prostate cancer screening is up-to-date.  Recommended a flu shot this fall.  Recommended shin Grix if it is reasonably priced.  Strongly encourage smoking cessation.  08/09/18 Since I last saw the patient, his son's condition has deteriorated.  He is now been given months to live.  Obviously this has the patient saddened.  He continues to have difficulty sleeping.  He did not see substantial benefit from Xanax and he is not taking that any longer.  He would like to try something else to help him sleep on "the bad nights".  Review of his shot records show that he is due for Shingrix as well as a flu shot this fall but otherwise his shots are up-to-date.  His colonoscopy was performed in 2018 and is not due again until 2023 due to the presence of a tubular adenoma.  Prostate cancer screening test is up-to-date as his PSA was 0.3.  He denies any depression.  He denies any falls.  He denies any memory loss.  His renal function has declined.  His creatinine has risen from 1.4-1.6.  He denies using any NSAIDs and he is drinking plenty of water on a daily basis.  Patient has been screened for AAA in the past and this was  negative.  He has mild peripheral vascular disease but his LDL cholesterol is well within goal.  Unfortunately continues to smoke a third of a pack of cigarettes per day.  He has no desire to quit.  We discussed lung cancer screening recommendations today. Immunization History  Administered Date(s) Administered  . Influenza, High Dose Seasonal PF 10/20/2013, 11/01/2015, 09/29/2016, 10/15/2017  . Influenza-Unspecified 10/06/2012  . Pneumococcal Conjugate-13 07/01/2013  . Pneumococcal Polysaccharide-23 05/18/2009, 07/30/2015  . Tdap 01/07/2011  . Zoster 06/26/2011    Past Medical History:  Diagnosis Date  . Adenomatous colon polyp   . Allergy    seasonal  . CKD (chronic kidney disease) stage 3, GFR 30-59 ml/min (HCC)   . Hyperlipidemia   . Hypertriglyceridemia   . PVD (peripheral vascular  disease) (HCC)    decreased TBI bilaterally  . Smoker    Past Surgical History:  Procedure Laterality Date  . CHOLECYSTECTOMY  1987  . EYE SURGERY     lasik   Current Outpatient Medications on File Prior to Visit  Medication Sig Dispense Refill  . ALPRAZolam (XANAX) 0.5 MG tablet Take 1 tablet (0.5 mg total) by mouth at bedtime as needed for anxiety. 30 tablet 0  . aspirin EC 81 MG tablet Take 81 mg by mouth daily.    Marland Kitchen atorvastatin (LIPITOR) 40 MG tablet TAKE 1 TABLET BY MOUTH EVERY DAY 90 tablet 3  . cholecalciferol (VITAMIN D) 1000 UNITS tablet Take 1,000 Units by mouth daily.    . fenofibrate 160 MG tablet Take 1 tablet (160 mg total) by mouth daily. 90 tablet 1  . fluticasone (FLONASE) 50 MCG/ACT nasal spray INSTILL 2 SPRAYS INTO EACH NOSTRILS DAILY 48 g 2  . loratadine (CLARITIN) 10 MG tablet Take 10 mg by mouth daily.    . Multiple Vitamins-Minerals (MULTIVITAMIN WITH MINERALS) tablet Take 1 tablet by mouth daily.    . vitamin C (ASCORBIC ACID) 500 MG tablet Take 500 mg by mouth daily.     Current Facility-Administered Medications on File Prior to Visit  Medication Dose Route Frequency Provider Last Rate Last Dose  . 0.9 %  sodium chloride infusion  500 mL Intravenous Continuous Danis, Kirke Corin, MD       No Known Allergies Social History   Socioeconomic History  . Marital status: Married    Spouse name: Not on file  . Number of children: Not on file  . Years of education: Not on file  . Highest education level: Not on file  Occupational History  . Not on file  Social Needs  . Financial resource strain: Not on file  . Food insecurity    Worry: Not on file    Inability: Not on file  . Transportation needs    Medical: Not on file    Non-medical: Not on file  Tobacco Use  . Smoking status: Current Every Day Smoker    Packs/day: 0.25    Types: Cigarettes  . Smokeless tobacco: Never Used  Substance and Sexual Activity  . Alcohol use: Yes    Alcohol/week: 14.0  standard drinks    Types: 7 Shots of liquor, 7 Standard drinks or equivalent per week    Comment: 1 drink per day per pt  . Drug use: No  . Sexual activity: Not on file  Lifestyle  . Physical activity    Days per week: Not on file    Minutes per session: Not on file  . Stress: Not on file  Relationships  .  Social Herbalist on phone: Not on file    Gets together: Not on file    Attends religious service: Not on file    Active member of club or organization: Not on file    Attends meetings of clubs or organizations: Not on file    Relationship status: Not on file  . Intimate partner violence    Fear of current or ex partner: Not on file    Emotionally abused: Not on file    Physically abused: Not on file    Forced sexual activity: Not on file  Other Topics Concern  . Not on file  Social History Narrative  . Not on file   Family History  Problem Relation Age of Onset  . Hypertension Mother   . Stroke Mother   . Diabetes Mother        borderline  . Arthritis Father   . ALS Son   . Colon cancer Neg Hx   . Esophageal cancer Neg Hx   . Rectal cancer Neg Hx   . Stomach cancer Neg Hx      Review of Systems  All other systems reviewed and are negative.      Objective:   Physical Exam  Constitutional: He is oriented to person, place, and time. He appears well-developed and well-nourished. No distress.  HENT:  Head: Normocephalic and atraumatic.  Right Ear: External ear normal.  Left Ear: External ear normal.  Nose: Nose normal.  Mouth/Throat: Oropharynx is clear and moist. No oropharyngeal exudate.  Eyes: Pupils are equal, round, and reactive to light. Conjunctivae and EOM are normal. Right eye exhibits no discharge. Left eye exhibits no discharge. No scleral icterus.  Neck: Normal range of motion. Neck supple. No JVD present. No tracheal deviation present. No thyromegaly present.  Cardiovascular: Normal rate, regular rhythm, normal heart sounds and intact  distal pulses. Exam reveals no gallop and no friction rub.  No murmur heard. Pulmonary/Chest: Effort normal and breath sounds normal. No stridor. No respiratory distress. He has no wheezes. He has no rales. He exhibits no tenderness.  Abdominal: Soft. Bowel sounds are normal. He exhibits no distension and no mass. There is no abdominal tenderness. There is no rebound and no guarding.  Genitourinary:    Prostate, penis and rectum normal.   Musculoskeletal: Normal range of motion.        General: No tenderness, deformity or edema.  Lymphadenopathy:    He has no cervical adenopathy.  Neurological: He is alert and oriented to person, place, and time. He has normal reflexes. No cranial nerve deficit. He exhibits normal muscle tone. Coordination normal.  Skin: Skin is warm. No rash noted. He is not diaphoretic. No erythema. No pallor.  Psychiatric: He has a normal mood and affect. His behavior is normal. Judgment and thought content normal.  Vitals reviewed.  Lab on 08/06/2018  Component Date Value Ref Range Status  . Cholesterol 08/06/2018 120  <200 mg/dL Final  . HDL 08/06/2018 54  > OR = 40 mg/dL Final  . Triglycerides 08/06/2018 78  <150 mg/dL Final  . LDL Cholesterol (Calc) 08/06/2018 50  mg/dL (calc) Final   Comment: Reference range: <100 . Desirable range <100 mg/dL for primary prevention;   <70 mg/dL for patients with CHD or diabetic patients  with > or = 2 CHD risk factors. Marland Kitchen LDL-C is now calculated using the Martin-Hopkins  calculation, which is a validated novel method providing  better accuracy than the [employer]  equation in the  estimation of LDL-C.  Cresenciano Genre et al. Annamaria Helling. 6387;564(33): 2061-2068  (http://education.[facility].com/faq/FAQ164)   . Total CHOL/HDL Ratio 08/06/2018 2.2  <5.0 (calc) Final  . Non-HDL Cholesterol (Calc) 08/06/2018 66  <130 mg/dL (calc) Final   Comment: For patients with diabetes plus 1 major ASCVD risk  factor, treating to a non-HDL-C goal  of <100 mg/dL  (LDL-C of <70 mg/dL) is considered a therapeutic  option.   . WBC 08/06/2018 9.2  3.8 - 10.8 Thousand/uL Final  . RBC 08/06/2018 4.74  4.20 - 5.80 Million/uL Final  . Hemoglobin 08/06/2018 14.2  13.2 - 17.1 g/dL Final  . HCT 08/06/2018 43.1  38.5 - 50.0 % Final  . MCV 08/06/2018 90.9  80.0 - 100.0 fL Final  . MCH 08/06/2018 30.0  27.0 - 33.0 pg Final  . MCHC 08/06/2018 32.9  32.0 - 36.0 g/dL Final  . RDW 08/06/2018 13.1  11.0 - 15.0 % Final  . Platelets 08/06/2018 306  140 - 400 Thousand/uL Final  . MPV 08/06/2018 10.4  7.5 - 12.5 fL Final  . Neutro Abs 08/06/2018 3,680  1,500 - 7,800 cells/uL Final  . Lymphs Abs 08/06/2018 4,103* 850 - 3,900 cells/uL Final  . Absolute Monocytes 08/06/2018 764  200 - 950 cells/uL Final  . Eosinophils Absolute 08/06/2018 515* 15 - 500 cells/uL Final  . Basophils Absolute 08/06/2018 138  0 - 200 cells/uL Final  . Neutrophils Relative % 08/06/2018 40  % Final  . Total Lymphocyte 08/06/2018 44.6  % Final  . Monocytes Relative 08/06/2018 8.3  % Final  . Eosinophils Relative 08/06/2018 5.6  % Final  . Basophils Relative 08/06/2018 1.5  % Final  . Glucose, Bld 08/06/2018 104* 65 - 99 mg/dL Final   Comment: .            Fasting reference interval . For someone without known diabetes, a glucose value between 100 and 125 mg/dL is consistent with prediabetes and should be confirmed with a follow-up test. .   . BUN 08/06/2018 23  7 - 25 mg/dL Final  . Creat 08/06/2018 1.61* 0.70 - 1.18 mg/dL Final   Comment: For patients >64 years of age, the reference limit for Creatinine is approximately 13% higher for people identified as African-American. .   Havery Moros Ratio 08/06/2018 14  6 - 22 (calc) Final  . Sodium 08/06/2018 142  135 - 146 mmol/L Final  . Potassium 08/06/2018 4.5  3.5 - 5.3 mmol/L Final  . Chloride 08/06/2018 110  98 - 110 mmol/L Final  . CO2 08/06/2018 23  20 - 32 mmol/L Final  . Calcium 08/06/2018 9.3  8.6 - 10.3  mg/dL Final  . Total Protein 08/06/2018 5.9* 6.1 - 8.1 g/dL Final  . Albumin 08/06/2018 3.9  3.6 - 5.1 g/dL Final  . Globulin 08/06/2018 2.0  1.9 - 3.7 g/dL (calc) Final  . AG Ratio 08/06/2018 2.0  1.0 - 2.5 (calc) Final  . Total Bilirubin 08/06/2018 0.5  0.2 - 1.2 mg/dL Final  . Alkaline phosphatase (APISO) 08/06/2018 68  35 - 144 U/L Final  . AST 08/06/2018 21  10 - 35 U/L Final  . ALT 08/06/2018 18  9 - 46 U/L Final  . PSA 08/06/2018 0.3  < OR = 4.0 ng/mL Final   Comment: The total PSA value from this assay system is  standardized against the WHO standard. The test  result will be approximately 20% lower when compared  to the equimolar-standardized total  PSA (Beckman  Coulter). Comparison of serial PSA results should be  interpreted with this fact in mind. . This test was performed using the Siemens  chemiluminescent method. Values obtained from  different assay methods cannot be used interchangeably. PSA levels, regardless of value, should not be interpreted as absolute evidence of the presence or absence of disease.   Appointment on 07/20/2018  Component Date Value Ref Range Status  . SARS-CoV-2, NAA 07/20/2018 Not Detected  Not Detected Final   Comment: This test was developed and its performance characteristics determined by [employer]. This test has not been FDA cleared or approved. This test has been authorized by FDA under an Emergency Use Authorization (EUA). This test is only authorized for the duration of time the declaration that circumstances exist justifying the authorization of the emergency use of in vitro diagnostic tests for detection of SARS-CoV-2 virus and/or diagnosis of COVID-19 infection under section 564(b)(1) of the Act, 21 U.S.C. 458PFY-9(W)(4), unless the authorization is terminated or revoked sooner. When diagnostic testing is negative, the possibility of a false negative result should be considered in the context of a patient's recent  exposures and the presence of clinical signs and symptoms consistent with COVID-19. An individual without symptoms of COVID-19 and who is not shedding SARS-CoV-2 virus would expect to have a negative (not detected) result in this assay.        Assessment:    The primary encounter diagnosis was General medical exam. Diagnoses of Pure hypercholesterolemia, CKD (chronic kidney disease) stage 3, GFR 30-59 ml/min ([facility]), Smoker, and Hypertriglyceridemia were also pertinent to this visit.      Plan:     Patient denies any falls, depression, or memory loss.  I recommended shingrix when available.  Also recommended a flu shot this fall.  Cancer screening is up-to-date.  We discussed lung cancer screening with a low-dose CT scan.  At the present time the patient prefers to deny this.  If he changes his mind I will be glad to schedule him.  Recommended smoking cessation but the patient is in the pre-contemplative phase.  I am concerned by the decline in his renal function.  Therefore I would like to obtain a renal ultrasound to evaluate further.  I have recommended that he discontinue fenofibrate and discontinue Xanax.  We will try Valium 5 mg p.o. nightly as needed insomnia and he will use this medication sparingly.  I would like to recheck his lab work in 6 months to keep a closer eye on his labs.  Otherwise regular anticipatory guidance is provided.

## 2018-08-14 ENCOUNTER — Other Ambulatory Visit: Payer: Self-pay | Admitting: Family Medicine

## 2018-08-16 ENCOUNTER — Other Ambulatory Visit: Payer: Self-pay

## 2018-08-16 ENCOUNTER — Ambulatory Visit (HOSPITAL_COMMUNITY)
Admission: RE | Admit: 2018-08-16 | Discharge: 2018-08-16 | Disposition: A | Discharge status: Home / Self Care | Payer: Medicare Other | Source: Ambulatory Visit | Attending: Family Medicine | Admitting: Family Medicine

## 2018-08-16 DIAGNOSIS — N183 Chronic kidney disease, stage 3 unspecified: Secondary | ICD-10-CM

## 2018-08-16 DIAGNOSIS — N189 Chronic kidney disease, unspecified: Secondary | ICD-10-CM | POA: Diagnosis not present

## 2018-08-29 ENCOUNTER — Other Ambulatory Visit: Payer: Self-pay | Admitting: Family Medicine

## 2018-10-04 DIAGNOSIS — H5213 Myopia, bilateral: Secondary | ICD-10-CM | POA: Diagnosis not present

## 2018-10-04 DIAGNOSIS — H52203 Unspecified astigmatism, bilateral: Secondary | ICD-10-CM | POA: Diagnosis not present

## 2018-10-04 DIAGNOSIS — H40013 Open angle with borderline findings, low risk, bilateral: Secondary | ICD-10-CM | POA: Diagnosis not present

## 2018-12-24 ENCOUNTER — Other Ambulatory Visit: Payer: Self-pay | Admitting: Family Medicine

## 2019-01-23 ENCOUNTER — Ambulatory Visit: Payer: Medicare Other | Attending: Internal Medicine

## 2019-01-23 DIAGNOSIS — Z23 Encounter for immunization: Secondary | ICD-10-CM

## 2019-01-23 NOTE — Progress Notes (Unsigned)
   Covid-19 Vaccination Clinic  Name:  Christian [name]    MRN: [id number] DOB: 11-13-1945  01/23/2019  [name] was observed post Covid-19 immunization for {COVID Vaccine Observation Times:23551} without incidence. He was provided with Vaccine Information Sheet and instruction to access the V-Safe system.   [name] was instructed to call 911 with any severe reactions post vaccine: Marland Kitchen Difficulty breathing  . Swelling of your face and throat  . A fast heartbeat  . A bad rash all over your body  . Dizziness and weakness

## 2019-02-09 ENCOUNTER — Other Ambulatory Visit: Payer: Self-pay | Admitting: Family Medicine

## 2019-02-09 ENCOUNTER — Other Ambulatory Visit: Payer: Self-pay

## 2019-02-09 ENCOUNTER — Other Ambulatory Visit: Payer: Medicare Other

## 2019-02-09 DIAGNOSIS — E781 Pure hyperglyceridemia: Secondary | ICD-10-CM

## 2019-02-09 DIAGNOSIS — N183 Chronic kidney disease, stage 3 unspecified: Secondary | ICD-10-CM

## 2019-02-09 DIAGNOSIS — E7800 Pure hypercholesterolemia, unspecified: Secondary | ICD-10-CM

## 2019-02-09 LAB — COMPREHENSIVE METABOLIC PANEL
AG Ratio: 1.7 (calc) (ref 1.0–2.5)
ALT: 32 U/L (ref 9–46)
AST: 20 U/L (ref 10–35)
Albumin: 4 g/dL (ref 3.6–5.1)
Alkaline phosphatase (APISO): 118 U/L (ref 35–144)
BUN: 14 mg/dL (ref 7–25)
CO2: 26 mmol/L (ref 20–32)
Calcium: 9.5 mg/dL (ref 8.6–10.3)
Chloride: 107 mmol/L (ref 98–110)
Creat: 1.18 mg/dL (ref 0.70–1.18)
Globulin: 2.3 g/dL (calc) (ref 1.9–3.7)
Glucose, Bld: 116 mg/dL — ABNORMAL HIGH (ref 65–99)
Potassium: 5.1 mmol/L (ref 3.5–5.3)
Sodium: 140 mmol/L (ref 135–146)
Total Bilirubin: 0.5 mg/dL (ref 0.2–1.2)
Total Protein: 6.3 g/dL (ref 6.1–8.1)

## 2019-02-09 LAB — LIPID PANEL
Cholesterol: 125 mg/dL (ref <200)
HDL: 55 mg/dL (ref >=40)
LDL Cholesterol (Calc): 53 mg/dL (calc)
Non-HDL Cholesterol (Calc): 70 mg/dL (calc) (ref <130)
Total CHOL/HDL Ratio: 2.3 (calc) (ref <5.0)
Triglycerides: 85 mg/dL (ref <150)

## 2019-02-09 LAB — CBC WITH DIFFERENTIAL/PLATELET
Absolute Monocytes: 836 cells/uL (ref 200–950)
Basophils Absolute: 124 cells/uL (ref 0–200)
Basophils Relative: 1.3 %
Eosinophils Absolute: 361 cells/uL (ref 15–500)
Eosinophils Relative: 3.8 %
HCT: 43.9 % (ref 38.5–50.0)
Hemoglobin: 14.9 g/dL (ref 13.2–17.1)
Lymphs Abs: 3696 cells/uL (ref 850–3900)
MCH: 29.9 pg (ref 27.0–33.0)
MCHC: 33.9 g/dL (ref 32.0–36.0)
MCV: 88 fL (ref 80.0–100.0)
MPV: 10 fL (ref 7.5–12.5)
Monocytes Relative: 8.8 %
Neutro Abs: 4484 cells/uL (ref 1500–7800)
Neutrophils Relative %: 47.2 %
Platelets: 308 10*3/uL (ref 140–400)
RBC: 4.99 10*6/uL (ref 4.20–5.80)
RDW: 12.7 % (ref 11.0–15.0)
Total Lymphocyte: 38.9 %
WBC: 9.5 10*3/uL (ref 3.8–10.8)

## 2019-02-09 NOTE — Telephone Encounter (Signed)
Requesting refill    Valium  LOV: 08/09/2018  LRF:  08/09/18

## 2019-02-09 NOTE — Telephone Encounter (Signed)
Patient calling to get refill on diazepam [facility]

## 2019-02-10 MED ORDER — DIAZEPAM 5 MG PO TABS: 5.0000 mg | ORAL_TABLET | Freq: Two times a day (BID) | ORAL | 1 refills | Status: DC | PRN

## 2019-02-13 ENCOUNTER — Ambulatory Visit: Payer: Medicare Other | Attending: Internal Medicine

## 2019-02-13 DIAGNOSIS — Z23 Encounter for immunization: Secondary | ICD-10-CM | POA: Insufficient documentation

## 2019-02-13 NOTE — Progress Notes (Signed)
   Covid-19 Vaccination Clinic  Name:  Christian [name]    MRN: [id number] DOB: [birth year]  02/13/2019  [name] was observed post Covid-19 immunization for 15 minutes without incidence. He was provided with Vaccine Information Sheet and instruction to access the V-Safe system.   [name] was instructed to call 911 with any severe reactions post vaccine: Marland Kitchen Difficulty breathing  . Swelling of your face and throat  . A fast heartbeat  . A bad rash all over your body  . Dizziness and weakness    Immunizations Administered    Name Date Dose VIS Date Route   Pfizer COVID-19 Vaccine 02/13/2019 11:04 AM 0.3 mL 12/17/2018 Intramuscular   Manufacturer: [place]   Lot: [id number]   [place]: [id number]

## 2019-02-17 ENCOUNTER — Ambulatory Visit: Payer: Medicare Other

## 2019-05-07 ENCOUNTER — Emergency Department (HOSPITAL_COMMUNITY): Payer: Medicare Other

## 2019-05-07 ENCOUNTER — Encounter (HOSPITAL_COMMUNITY): Payer: Self-pay | Admitting: Radiology

## 2019-05-07 ENCOUNTER — Inpatient Hospital Stay (HOSPITAL_COMMUNITY)
Admission: EM | Admit: 2019-05-07 | Discharge: 2019-05-09 | DRG: 041 | Disposition: A | Discharge status: Home / Self Care | Payer: Medicare Other | Attending: Internal Medicine | Admitting: Internal Medicine

## 2019-05-07 DIAGNOSIS — I639 Cerebral infarction, unspecified: Secondary | ICD-10-CM

## 2019-05-07 DIAGNOSIS — R42 Dizziness and giddiness: Secondary | ICD-10-CM | POA: Diagnosis not present

## 2019-05-07 DIAGNOSIS — Z7982 Long term (current) use of aspirin: Secondary | ICD-10-CM

## 2019-05-07 DIAGNOSIS — G8194 Hemiplegia, unspecified affecting left nondominant side: Secondary | ICD-10-CM | POA: Diagnosis present

## 2019-05-07 DIAGNOSIS — Z20822 Contact with and (suspected) exposure to covid-19: Secondary | ICD-10-CM | POA: Diagnosis present

## 2019-05-07 DIAGNOSIS — R4702 Dysphasia: Secondary | ICD-10-CM | POA: Diagnosis not present

## 2019-05-07 DIAGNOSIS — F17200 Nicotine dependence, unspecified, uncomplicated: Secondary | ICD-10-CM | POA: Diagnosis not present

## 2019-05-07 DIAGNOSIS — I6389 Other cerebral infarction: Secondary | ICD-10-CM | POA: Diagnosis not present

## 2019-05-07 DIAGNOSIS — E785 Hyperlipidemia, unspecified: Secondary | ICD-10-CM | POA: Diagnosis present

## 2019-05-07 DIAGNOSIS — I129 Hypertensive chronic kidney disease with stage 1 through stage 4 chronic kidney disease, or unspecified chronic kidney disease: Secondary | ICD-10-CM | POA: Diagnosis present

## 2019-05-07 DIAGNOSIS — R29898 Other symptoms and signs involving the musculoskeletal system: Secondary | ICD-10-CM | POA: Diagnosis not present

## 2019-05-07 DIAGNOSIS — I6501 Occlusion and stenosis of right vertebral artery: Secondary | ICD-10-CM | POA: Diagnosis not present

## 2019-05-07 DIAGNOSIS — N183 Chronic kidney disease, stage 3 unspecified: Secondary | ICD-10-CM | POA: Diagnosis present

## 2019-05-07 DIAGNOSIS — R4789 Other speech disturbances: Secondary | ICD-10-CM | POA: Diagnosis not present

## 2019-05-07 DIAGNOSIS — Z9049 Acquired absence of other specified parts of digestive tract: Secondary | ICD-10-CM

## 2019-05-07 DIAGNOSIS — I6523 Occlusion and stenosis of bilateral carotid arteries: Secondary | ICD-10-CM | POA: Diagnosis not present

## 2019-05-07 DIAGNOSIS — R29702 NIHSS score 2: Secondary | ICD-10-CM | POA: Diagnosis not present

## 2019-05-07 DIAGNOSIS — R739 Hyperglycemia, unspecified: Secondary | ICD-10-CM | POA: Diagnosis not present

## 2019-05-07 DIAGNOSIS — F17210 Nicotine dependence, cigarettes, uncomplicated: Secondary | ICD-10-CM | POA: Diagnosis present

## 2019-05-07 DIAGNOSIS — R29810 Facial weakness: Secondary | ICD-10-CM | POA: Diagnosis present

## 2019-05-07 DIAGNOSIS — I63113 Cerebral infarction due to embolism of bilateral vertebral arteries: Secondary | ICD-10-CM | POA: Diagnosis not present

## 2019-05-07 DIAGNOSIS — Z79899 Other long term (current) drug therapy: Secondary | ICD-10-CM

## 2019-05-07 DIAGNOSIS — Z743 Need for continuous supervision: Secondary | ICD-10-CM | POA: Diagnosis not present

## 2019-05-07 DIAGNOSIS — E7800 Pure hypercholesterolemia, unspecified: Secondary | ICD-10-CM | POA: Diagnosis not present

## 2019-05-07 DIAGNOSIS — I6340 Cerebral infarction due to embolism of unspecified cerebral artery: Secondary | ICD-10-CM | POA: Diagnosis not present

## 2019-05-07 DIAGNOSIS — R29818 Other symptoms and signs involving the nervous system: Secondary | ICD-10-CM | POA: Diagnosis not present

## 2019-05-07 DIAGNOSIS — R4781 Slurred speech: Secondary | ICD-10-CM

## 2019-05-07 DIAGNOSIS — R1310 Dysphagia, unspecified: Secondary | ICD-10-CM | POA: Diagnosis present

## 2019-05-07 DIAGNOSIS — Z03818 Encounter for observation for suspected exposure to other biological agents ruled out: Secondary | ICD-10-CM | POA: Diagnosis not present

## 2019-05-07 DIAGNOSIS — N1831 Chronic kidney disease, stage 3a: Secondary | ICD-10-CM | POA: Diagnosis not present

## 2019-05-07 DIAGNOSIS — Z823 Family history of stroke: Secondary | ICD-10-CM | POA: Diagnosis not present

## 2019-05-07 DIAGNOSIS — R0902 Hypoxemia: Secondary | ICD-10-CM | POA: Diagnosis not present

## 2019-05-07 LAB — I-STAT CHEM 8, ED
BUN: 21 mg/dL (ref 8–23)
Calcium, Ion: 1.07 mmol/L — ABNORMAL LOW (ref 1.15–1.40)
Chloride: 105 mmol/L (ref 98–111)
Creatinine, Ser: 1.1 mg/dL (ref 0.61–1.24)
Glucose, Bld: 116 mg/dL — ABNORMAL HIGH (ref 70–99)
HCT: 46 % (ref 39.0–52.0)
Hemoglobin: 15.6 g/dL (ref 13.0–17.0)
Potassium: 3.6 mmol/L (ref 3.5–5.1)
Sodium: 138 mmol/L (ref 135–145)
TCO2: 21 mmol/L — ABNORMAL LOW (ref 22–32)

## 2019-05-07 LAB — CBC
HCT: 46.6 % (ref 39.0–52.0)
Hemoglobin: 15.4 g/dL (ref 13.0–17.0)
MCH: 30.1 pg (ref 26.0–34.0)
MCHC: 33 g/dL (ref 30.0–36.0)
MCV: 91.2 fL (ref 80.0–100.0)
Platelets: 302 10*3/uL (ref 150–400)
RBC: 5.11 MIL/uL (ref 4.22–5.81)
RDW: 13.2 % (ref 11.5–15.5)
WBC: 11.5 10*3/uL — ABNORMAL HIGH (ref 4.0–10.5)
nRBC: 0 % (ref 0.0–0.2)

## 2019-05-07 LAB — PROTIME-INR
INR: 1 (ref 0.8–1.2)
Prothrombin Time: 13.1 seconds (ref 11.4–15.2)

## 2019-05-07 LAB — COMPREHENSIVE METABOLIC PANEL
ALT: 39 U/L (ref 0–44)
AST: 29 U/L (ref 15–41)
Albumin: 3.9 g/dL (ref 3.5–5.0)
Alkaline Phosphatase: 120 U/L (ref 38–126)
Anion gap: 15 (ref 5–15)
BUN: 20 mg/dL (ref 8–23)
CO2: 19 mmol/L — ABNORMAL LOW (ref 22–32)
Calcium: 9.1 mg/dL (ref 8.9–10.3)
Chloride: 103 mmol/L (ref 98–111)
Creatinine, Ser: 1.19 mg/dL (ref 0.61–1.24)
GFR calc Af Amer: >60 mL/min (ref >60)
GFR calc non Af Amer: 60 mL/min — ABNORMAL LOW (ref >60)
Glucose, Bld: 122 mg/dL — ABNORMAL HIGH (ref 70–99)
Potassium: 3.7 mmol/L (ref 3.5–5.1)
Sodium: 137 mmol/L (ref 135–145)
Total Bilirubin: 0.9 mg/dL (ref 0.3–1.2)
Total Protein: 6.5 g/dL (ref 6.5–8.1)

## 2019-05-07 LAB — DIFFERENTIAL
Abs Immature Granulocytes: 0.08 10*3/uL — ABNORMAL HIGH (ref 0.00–0.07)
Basophils Absolute: 0.2 10*3/uL — ABNORMAL HIGH (ref 0.0–0.1)
Basophils Relative: 2 %
Eosinophils Absolute: 0.3 10*3/uL (ref 0.0–0.5)
Eosinophils Relative: 2 %
Immature Granulocytes: 1 %
Lymphocytes Relative: 41 %
Lymphs Abs: 4.7 10*3/uL — ABNORMAL HIGH (ref 0.7–4.0)
Monocytes Absolute: 0.8 10*3/uL (ref 0.1–1.0)
Monocytes Relative: 7 %
Neutro Abs: 5.4 10*3/uL (ref 1.7–7.7)
Neutrophils Relative %: 47 %

## 2019-05-07 LAB — RESPIRATORY PANEL BY RT PCR (FLU A&B, COVID)
Influenza A by PCR: NEGATIVE
Influenza B by PCR: NEGATIVE
SARS Coronavirus 2 by RT PCR: NEGATIVE

## 2019-05-07 LAB — APTT: aPTT: 28 seconds (ref 24–36)

## 2019-05-07 LAB — CBG MONITORING, ED: Glucose-Capillary: 113 mg/dL — ABNORMAL HIGH (ref 70–99)

## 2019-05-07 MED ORDER — SENNOSIDES-DOCUSATE SODIUM 8.6-50 MG PO TABS: 1.0000 | ORAL_TABLET | Freq: Every evening | ORAL | Status: DC | PRN

## 2019-05-07 MED ORDER — ONDANSETRON HCL 4 MG/2ML IJ SOLN: 4.0000 mg | Freq: Once | INTRAMUSCULAR | Status: AC

## 2019-05-07 MED ORDER — LORATADINE 10 MG PO TABS
10.0000 mg | ORAL_TABLET | Freq: Every day | ORAL | Status: DC
  Administered 2019-05-07 – 2019-05-09 (×3): 10 mg via ORAL
  Filled 2019-05-07 (×3): qty 1

## 2019-05-07 MED ORDER — VITAMIN D 25 MCG (1000 UNIT) PO TABS
1000.0000 [IU] | ORAL_TABLET | Freq: Every day | ORAL | Status: DC
  Administered 2019-05-08 – 2019-05-09 (×2): 1000 [IU] via ORAL
  Filled 2019-05-07 (×2): qty 1

## 2019-05-07 MED ORDER — ACETAMINOPHEN 650 MG RE SUPP: 650.0000 mg | RECTAL | Status: DC | PRN

## 2019-05-07 MED ORDER — HEPARIN SODIUM (PORCINE) 5000 UNIT/ML IJ SOLN
5000.0000 [IU] | Freq: Three times a day (TID) | INTRAMUSCULAR | Status: DC
  Administered 2019-05-07 – 2019-05-09 (×6): 5000 [IU] via SUBCUTANEOUS
  Filled 2019-05-07 (×6): qty 1

## 2019-05-07 MED ORDER — CLOPIDOGREL BISULFATE 300 MG PO TABS
300.0000 mg | ORAL_TABLET | Freq: Once | ORAL | Status: AC
  Administered 2019-05-07: 300 mg via ORAL
  Filled 2019-05-07: qty 1

## 2019-05-07 MED ORDER — FLUTICASONE PROPIONATE 50 MCG/ACT NA SUSP
2.0000 | Freq: Every day | NASAL | Status: DC
  Administered 2019-05-08 – 2019-05-09 (×2): 2 via NASAL
  Filled 2019-05-07 (×2): qty 16

## 2019-05-07 MED ORDER — SODIUM CHLORIDE 0.9 % IV SOLN: INTRAVENOUS | Status: DC

## 2019-05-07 MED ORDER — ADULT MULTIVITAMIN W/MINERALS CH
1.0000 | ORAL_TABLET | Freq: Every day | ORAL | Status: DC
  Administered 2019-05-08 – 2019-05-09 (×2): 1 via ORAL
  Filled 2019-05-07 (×3): qty 1

## 2019-05-07 MED ORDER — ATORVASTATIN CALCIUM 40 MG PO TABS
40.0000 mg | ORAL_TABLET | Freq: Every day | ORAL | Status: DC
  Administered 2019-05-07 – 2019-05-09 (×3): 40 mg via ORAL
  Filled 2019-05-07 (×3): qty 1

## 2019-05-07 MED ORDER — ACETAMINOPHEN 325 MG PO TABS
650.0000 mg | ORAL_TABLET | ORAL | Status: DC | PRN
  Administered 2019-05-08: 650 mg via ORAL
  Filled 2019-05-07: qty 2

## 2019-05-07 MED ORDER — ONDANSETRON HCL 4 MG/2ML IJ SOLN
INTRAMUSCULAR | Status: AC
  Administered 2019-05-07: 4 mg via INTRAVENOUS
  Filled 2019-05-07: qty 2

## 2019-05-07 MED ORDER — ASCORBIC ACID 500 MG PO TABS
500.0000 mg | ORAL_TABLET | Freq: Every day | ORAL | Status: DC
  Administered 2019-05-08 – 2019-05-09 (×2): 500 mg via ORAL
  Filled 2019-05-07 (×2): qty 1

## 2019-05-07 MED ORDER — IOHEXOL 350 MG/ML SOLN
50.0000 mL | Freq: Once | INTRAVENOUS | Status: AC | PRN
  Administered 2019-05-07: 50 mL via INTRAVENOUS

## 2019-05-07 MED ORDER — CLOPIDOGREL BISULFATE 75 MG PO TABS
75.0000 mg | ORAL_TABLET | Freq: Every day | ORAL | Status: DC
  Administered 2019-05-08 – 2019-05-09 (×2): 75 mg via ORAL
  Filled 2019-05-07 (×3): qty 1

## 2019-05-07 MED ORDER — ASPIRIN EC 81 MG PO TBEC
81.0000 mg | DELAYED_RELEASE_TABLET | Freq: Every day | ORAL | Status: DC
  Administered 2019-05-07 – 2019-05-08 (×2): 81 mg via ORAL
  Filled 2019-05-07 (×2): qty 1

## 2019-05-07 MED ORDER — ACETAMINOPHEN 160 MG/5ML PO SOLN: 650.0000 mg | ORAL | Status: DC | PRN

## 2019-05-07 MED ORDER — MULTI-VITAMIN/MINERALS PO TABS: 1.0000 | ORAL_TABLET | Freq: Every day | ORAL | Status: DC

## 2019-05-07 MED ORDER — STROKE: EARLY STAGES OF RECOVERY BOOK
Freq: Once | Status: AC
  Filled 2019-05-07: qty 1

## 2019-05-07 MED ORDER — SODIUM CHLORIDE 0.9% FLUSH: 3.0000 mL | Freq: Once | INTRAVENOUS | Status: DC

## 2019-05-07 NOTE — ED Provider Notes (Signed)
[facility] EMERGENCY DEPARTMENT Provider Note   CSN: [id number] Arrival date & time: 05/07/19  1444  An emergency department physician performed an initial assessment on this suspected stroke patient at 1445.  History No chief complaint on file.   Errol Kresge is a 74 y.o. male.  74 y.o male with a PMH of CKD, Hyperlipidemia, PVD presents to the ED via EMS with a chief complaint of sudden onset dizziness and nausea at 1354. EMS called code stroke in route. They noted left facial droop and difficulty swallowing.  He was also found to be diaphoretic.  Patient arrived in the ED around 244, left droop noted, while in CT symptoms improved.  Evaluated by neurology team.  Ambulated onto CT table.  Lateral information obtained from patient after evaluation via CT scan.  Patient reports drinking last night, states today symptoms came on suddenly while he was at the [employer] trying to snap a photo.  He also noted his left arm to be weak, the symptom improved while arriving at the emergency department.  Reports he sat on the ground and symptoms continued.  He does not have any pain in his chest, shortness of breath, headache.  The history is provided by the patient.       Past Medical History:  Diagnosis Date  . Adenomatous colon polyp   . Allergy    seasonal  . CKD (chronic kidney disease) stage 3, GFR 30-59 ml/min   . Hyperlipidemia   . Hypertriglyceridemia   . PVD (peripheral vascular disease) (HCC)    decreased TBI bilaterally  . Smoker     Patient Active Problem List   Diagnosis Date Noted  . CKD (chronic kidney disease) stage 3, GFR 30-59 ml/min   . Hypertriglyceridemia   . Smoker   . Hyperlipidemia 06/30/2012  . Colon polyp, hyperplastic 06/30/2012    Past Surgical History:  Procedure Laterality Date  . CHOLECYSTECTOMY  1987  . EYE SURGERY     lasik       Family History  Problem Relation Age of Onset  . Hypertension Mother   . Stroke Mother    . Diabetes Mother        borderline  . Arthritis Father   . ALS Son   . Colon cancer Neg Hx   . Esophageal cancer Neg Hx   . Rectal cancer Neg Hx   . Stomach cancer Neg Hx     Social History   Tobacco Use  . Smoking status: Current Every Day Smoker    Packs/day: 0.25    Types: Cigarettes  . Smokeless tobacco: Never Used  Substance Use Topics  . Alcohol use: Yes    Alcohol/week: 14.0 standard drinks    Types: 7 Shots of liquor, 7 Standard drinks or equivalent per week    Comment: 1 drink per day per pt  . Drug use: No    Home Medications Prior to Admission medications   Medication Sig Start Date End Date Taking? Authorizing Provider  aspirin EC 81 MG tablet Take 81 mg by mouth daily.    [provider]  atorvastatin (LIPITOR) 40 MG tablet TAKE 1 TABLET BY MOUTH EVERY DAY 08/16/18   Susy Frizzle, MD  cholecalciferol (VITAMIN D) 1000 UNITS tablet Take 1,000 Units by mouth daily.    [provider]  diazepam (VALIUM) 5 MG tablet Take 1 tablet (5 mg total) by mouth every 12 (twelve) hours as needed for anxiety. 02/10/19   Jenna Luo  T, MD  fenofibrate 160 MG tablet TAKE 1 TABLET BY MOUTH EVERY DAY 08/30/18   Susy Frizzle, MD  fluticasone [facility]) 50 MCG/ACT nasal spray PLACE 2 SPRAYS INTO EACH NOSTRIL DAILY 12/24/18   Susy Frizzle, MD  loratadine (CLARITIN) 10 MG tablet Take 10 mg by mouth daily.    [provider]  Multiple Vitamins-Minerals (MULTIVITAMIN WITH MINERALS) tablet Take 1 tablet by mouth daily.    [provider]  vitamin C (ASCORBIC ACID) 500 MG tablet Take 500 mg by mouth daily.    [provider]    Allergies    Patient has no known allergies.  Review of Systems   Review of Systems  Constitutional: Negative for fever.  HENT: Negative for sore throat.   Respiratory: Negative for shortness of breath.   Cardiovascular: Negative for chest pain.  Gastrointestinal: Negative for abdominal pain,  diarrhea, nausea and vomiting.  Genitourinary: Negative for flank pain.  Musculoskeletal: Negative for back pain.  Skin: Negative for pallor and wound.  Neurological: Positive for dizziness and weakness.  All other systems reviewed and are negative.   Physical Exam Updated Vital Signs BP (!) 157/86   Pulse 66   Resp 16   Wt 77.3 kg   SpO2 97%   BMI 25.17 kg/m   Physical Exam Vitals and nursing note reviewed.  Constitutional:      Appearance: He is ill-appearing.  HENT:     Head: Normocephalic and atraumatic.     Mouth/Throat:     Mouth: Mucous membranes are moist.  Eyes:     Pupils: Pupils are equal, round, and reactive to light.  Cardiovascular:     Rate and Rhythm: Normal rate.  Pulmonary:     Effort: Pulmonary effort is normal.     Breath sounds: No wheezing or rales.  Abdominal:     General: Abdomen is flat.     Tenderness: There is no abdominal tenderness.  Musculoskeletal:     Cervical back: Normal range of motion and neck supple.  Skin:    General: Skin is warm and dry.  Neurological:     Mental Status: He is oriented to person, place, and time.     Comments: Left facial droop Slurred speech remains.  Moves all upper and lower extremities LUE weakness improved from initial exam.      ED Results / Procedures / Treatments   Labs (all labs ordered are listed, but only abnormal results are displayed) Labs Reviewed  CBC - Abnormal; Notable for the following components:      Result Value   WBC 11.5 (*)    All other components within normal limits  DIFFERENTIAL - Abnormal; Notable for the following components:   Lymphs Abs 4.7 (*)    Basophils Absolute 0.2 (*)    Abs Immature Granulocytes 0.08 (*)    All other components within normal limits  COMPREHENSIVE METABOLIC PANEL - Abnormal; Notable for the following components:   CO2 19 (*)    Glucose, Bld 122 (*)    GFR calc non Af Amer 60 (*)    All other components within normal limits  CBG MONITORING,  ED - Abnormal; Notable for the following components:   Glucose-Capillary 113 (*)    All other components within normal limits  I-STAT CHEM 8, ED - Abnormal; Notable for the following components:   Glucose, Bld 116 (*)    Calcium, Ion 1.07 (*)    TCO2 21 (*)    All other  components within normal limits  RESPIRATORY PANEL BY RT PCR (FLU A&B, COVID)  PROTIME-INR  APTT  URINALYSIS, ROUTINE W REFLEX MICROSCOPIC  CBG MONITORING, ED    EKG None  Radiology CT Code Stroke CTA Head W/WO contrast  Result Date: 05/07/2019 CLINICAL DATA:  Acute neuro deficit. Dysarthria, dizziness, facial droop, nausea and vomiting EXAM: CT ANGIOGRAPHY HEAD AND NECK TECHNIQUE: Multidetector CT imaging of the head and neck was performed using the standard protocol during bolus administration of intravenous contrast. Multiplanar CT image reconstructions and MIPs were obtained to evaluate the vascular anatomy. Carotid stenosis measurements (when applicable) are obtained utilizing NASCET criteria, using the distal internal carotid diameter as the denominator. CONTRAST:  67mL OMNIPAQUE IOHEXOL 350 MG/ML SOLN COMPARISON:  CT head 05/07/2019 FINDINGS: CTA NECK FINDINGS Aortic arch: Standard branching. Proximal great vessels widely patent. Mild atherosclerotic calcification aortic arch. Right carotid system: Mild atherosclerotic calcification right carotid bifurcation without significant stenosis. Left carotid system: Atherosclerotic calcification left carotid bulb without significant stenosis. Vertebral arteries: Left vertebral dominant. Left vertebral artery widely patent to the basilar without stenosis. Right vertebral artery is patent to the C1 level where there is tapered occlusion with ill-defined margins suggesting acute thrombus. No atherosclerotic disease at this level. Skeleton: Cervical spondylosis.  No acute skeletal abnormality. Other neck: Negative Upper chest: Lung apices clear bilaterally. Review of the MIP images  confirms the above findings CTA HEAD FINDINGS Anterior circulation: Cavernous carotid widely patent bilaterally without stenosis. Anterior and middle cerebral arteries widely patent bilaterally. Hyperdense right M1 segment on the CT does not correlate with large vessel occlusion. Posterior circulation: Occlusion of the distal right vertebral artery at the C1 level. This is likely acute with ill-defined margins. There is reconstitution of the very distal portion of the right vertebral artery. Left vertebral artery is patent to the basilar. PICA patent bilaterally. Basilar widely patent. Superior cerebellar and posterior cerebral arteries patent bilaterally without stenosis. Venous sinuses: Normal venous enhancement. Anatomic variants: None Review of the MIP images confirms the above findings IMPRESSION: 1. Occlusion of the distal right vertebral artery at the C1 level through the V4 level. This has ill-defined margins and likely is acute either due dissection or less likely atherosclerotic disease. No significant atherosclerotic calcification is seen in the area. 2. Mild atherosclerotic disease carotid bifurcation bilaterally without stenosis. Left vertebral artery patent to the basilar without stenosis. 3. No evidence of right M2 large vessel occlusion. Electronically Signed   By: Franchot Gallo M.D.   On: 05/07/2019 15:29   CT Code Stroke CTA Neck W/WO contrast  Result Date: 05/07/2019 CLINICAL DATA:  Acute neuro deficit. Dysarthria, dizziness, facial droop, nausea and vomiting EXAM: CT ANGIOGRAPHY HEAD AND NECK TECHNIQUE: Multidetector CT imaging of the head and neck was performed using the standard protocol during bolus administration of intravenous contrast. Multiplanar CT image reconstructions and MIPs were obtained to evaluate the vascular anatomy. Carotid stenosis measurements (when applicable) are obtained utilizing NASCET criteria, using the distal internal carotid diameter as the denominator. CONTRAST:   58mL OMNIPAQUE IOHEXOL 350 MG/ML SOLN COMPARISON:  CT head 05/07/2019 FINDINGS: CTA NECK FINDINGS Aortic arch: Standard branching. Proximal great vessels widely patent. Mild atherosclerotic calcification aortic arch. Right carotid system: Mild atherosclerotic calcification right carotid bifurcation without significant stenosis. Left carotid system: Atherosclerotic calcification left carotid bulb without significant stenosis. Vertebral arteries: Left vertebral dominant. Left vertebral artery widely patent to the basilar without stenosis. Right vertebral artery is patent to the C1 level where there is tapered occlusion with  ill-defined margins suggesting acute thrombus. No atherosclerotic disease at this level. Skeleton: Cervical spondylosis.  No acute skeletal abnormality. Other neck: Negative Upper chest: Lung apices clear bilaterally. Review of the MIP images confirms the above findings CTA HEAD FINDINGS Anterior circulation: Cavernous carotid widely patent bilaterally without stenosis. Anterior and middle cerebral arteries widely patent bilaterally. Hyperdense right M1 segment on the CT does not correlate with large vessel occlusion. Posterior circulation: Occlusion of the distal right vertebral artery at the C1 level. This is likely acute with ill-defined margins. There is reconstitution of the very distal portion of the right vertebral artery. Left vertebral artery is patent to the basilar. PICA patent bilaterally. Basilar widely patent. Superior cerebellar and posterior cerebral arteries patent bilaterally without stenosis. Venous sinuses: Normal venous enhancement. Anatomic variants: None Review of the MIP images confirms the above findings IMPRESSION: 1. Occlusion of the distal right vertebral artery at the C1 level through the V4 level. This has ill-defined margins and likely is acute either due dissection or less likely atherosclerotic disease. No significant atherosclerotic calcification is seen in the  area. 2. Mild atherosclerotic disease carotid bifurcation bilaterally without stenosis. Left vertebral artery patent to the basilar without stenosis. 3. No evidence of right M2 large vessel occlusion. Electronically Signed   By: Franchot Gallo M.D.   On: 05/07/2019 15:29   MR BRAIN WO CONTRAST  Result Date: 05/07/2019 CLINICAL DATA:  Stroke. EXAM: MRI HEAD WITHOUT CONTRAST TECHNIQUE: Multiplanar, multiecho pulse sequences of the brain and surrounding structures were obtained without intravenous contrast. COMPARISON:  CT angio head neck today FINDINGS: Brain: Acute infarct in the right cerebellar tonsil, right posterior cerebellum, and left pons. In addition, there is a small acute infarct in the left parietal lobe. Negative for hemorrhage or mass. Ventricle size normal. Scattered small white matter hyperintensities compatible with mild chronic microvascular ischemia. Chronic ischemic change in the pons left greater than right. Vascular: Occlusion distal right vertebral artery with hyperintense thrombus as noted on CTA. Distal left vertebral artery and basilar artery and both internal carotid artery showed normal arterial flow voids. Skull and upper cervical spine: No focal skeletal lesion. Sinuses/Orbits: Mucosal edema paranasal sinuses.  Negative orbit Other: None IMPRESSION: Acute infarct in the right cerebellar tonsil, right cerebellum, and left pons. Small acute infarct in the left parietal lobe. Occlusion distal right vertebral artery which appears acute on CTA. Electronically Signed   By: Franchot Gallo M.D.   On: 05/07/2019 16:45   CT HEAD CODE STROKE WO CONTRAST  Result Date: 05/07/2019 CLINICAL DATA:  Code stroke. Facial droop dizziness nausea vomiting. Dysarthria. EXAM: CT HEAD WITHOUT CONTRAST TECHNIQUE: Contiguous axial images were obtained from the base of the skull through the vertex without intravenous contrast. COMPARISON:  None. FINDINGS: Brain: Mild atrophy and mild chronic microvascular  ischemic changes in the white matter. No acute cortical infarct. Negative for hemorrhage or mass. Vascular: Possible hyperdense right M2 branch in the sylvian fissure. Correlate with CTA findings. Skull: Negative Sinuses/Orbits: Mucosal edema paranasal sinuses.  Negative orbit. Other: None ASPECTS ([place] Stroke Program Early CT Score) - Ganglionic level infarction (caudate, lentiform nuclei, internal capsule, insula, M1-M3 cortex): 7 - Supraganglionic infarction (M4-M6 cortex): 3 Total score (0-10 with 10 being normal): 10 IMPRESSION: 1. No acute abnormality. Atrophy and chronic microvascular ischemia. 2. ASPECTS is 10 3. Possible hyperdense right M2 segment in the sylvian fissure. Correlate with CTA findings. 4. Preliminary report texted to Dr. Lorraine Lax on  [facility] Electronically Signed   By: Juanda Crumble  Carlis Abbott M.D.   On: 05/07/2019 15:09    Procedures .Critical Care Performed by: Janeece Fitting, PA-C Authorized by: Janeece Fitting, PA-C   Critical care provider statement:    Critical care time (minutes):  45   Critical care start time:  05/07/2019 4:00 PM   Critical care end time:  05/07/2019 4:45 PM   Critical care time was exclusive of:  Separately billable procedures and treating other patients   Critical care was necessary to treat or prevent imminent or life-threatening deterioration of the following conditions:  CNS failure or compromise   Critical care was time spent personally by me on the following activities:  Blood draw for specimens, development of treatment plan with patient or surrogate, discussions with consultants, evaluation of patient's response to treatment, examination of patient, obtaining history from patient or surrogate, ordering and performing treatments and interventions, ordering and review of laboratory studies, ordering and review of radiographic studies, pulse oximetry, re-evaluation of patient's condition and review of old charts   (including critical care time)  Medications  Ordered in ED Medications  sodium chloride flush (NS) 0.9 % injection 3 mL (has no administration in time range)  clopidogrel (PLAVIX) tablet 300 mg (has no administration in time range)  clopidogrel (PLAVIX) tablet 75 mg (has no administration in time range)  aspirin EC tablet 81 mg (has no administration in time range)  ondansetron (ZOFRAN) injection 4 mg (4 mg Intravenous Given 05/07/19 1510)  iohexol (OMNIPAQUE) 350 MG/ML injection 50 mL (50 mLs Intravenous Contrast Given 05/07/19 1508)    ED Course  I have reviewed the triage vital signs and the nursing notes.  Pertinent labs & imaging results that were available during my care of the patient were reviewed by me and considered in my medical decision making (see chart for details).    MDM Rules/Calculators/A&P   [place] along with neurology team.  Proceeded to CT scan to obtain CT head.  Symptoms began at [employer] around 154 where he experienced dizziness, left arm weakness.  During CT symptoms seem to improve.  He also noted some slurred speech.  On evaluation patient is in no acute distress, does report improvement on the left arm after CT scan.  Does report his speech continues to be slurred, not back to his baseline yet.  No other focal neuro deficit on my exam.  Moves all extremities.  Is are clear to auscultation, abdomen is soft nontender to palpation, no visible wounds, no bilateral leg swelling.  Interpretation of his labs without any electrolyte derangement on his CMP, current levels within normal limits.  LFTs were unremarkable.  CBC with a mild leukocytosis at 11.5, he did have one episode of emesis during this encounter.  PT and INR within normal limits.  Rapid Covid is negative.  Dr. Lorraine Lax evaluated patient at the bedside, plan is for patient to be monitor up until 38, if he develops any focal symptoms, he is a candidate for the PA as he is in the window however this will be held off  and reassess until 6:30 PM tonight.  If symptoms do not return, patient will need admission for TIA work-up.  CT Code stroke:  1. Occlusion of the distal right vertebral artery at the C1 level  through the V4 level. This has ill-defined margins and likely is  acute either due dissection or less likely atherosclerotic disease.  No significant atherosclerotic calcification is seen in the area.  2. Mild atherosclerotic disease carotid  bifurcation bilaterally  without stenosis. Left vertebral artery patent to the basilar  without stenosis.  3. No evidence of right M2 large vessel occlusion.       MRI brain: Acute infarct in the right cerebellar tonsil, right cerebellum, and  left pons. Small acute infarct in the left parietal lobe.    Occlusion distal right vertebral artery which appears acute on CTA.     6:42 PM called placed for hospitalist admission, patient has been updated on plan and wife at the bedside agreeable with stay in house.  6:59 PM Spoke to hospitalist service who agrees on pt admission. Patient stable for further management.   Portions of this note were generated with [employer]. Dictation errors may occur despite best attempts at proofreading.  Final Clinical Impression(s) / ED Diagnoses Final diagnoses:  Left arm weakness  Slurred speech    Rx / DC Orders ED Discharge Orders    None       Janeece Fitting, PA-C 05/07/19 1859    Lajean Saver, MD 05/08/19 1610

## 2019-05-07 NOTE — Code Documentation (Signed)
[place] [employer] [place] had a sudden onset of dizziness and nausea at 1354.  EMS called a code stroke en route at 1430, left facial droop and difficulty swallowing.  He was also diaphoretic.  Continues to be dizzy and nauseated.  Patient arrived at 1444,  Left facial droop noted upon arrival, while in CT pt improved, no facial droop.  When we sat him up he became very dizzy and vomited.  Zofran given.  Dr Aroor at bedside assessing patient.  Ambulated to CT table.  Denies double vision, continues to complain of dizziness.  He has mild dysarthria.  Stat head CT done, CTA done.    He is in the TPA window until 1830, q15 min VS and q75min neuro checks.  RN to notify Dr Aroor if he worsens.    Plan: MRI

## 2019-05-07 NOTE — Consult Note (Addendum)
NEURO HOSPITALIST  CONSULT   Requesting Physician: Dr. Ashok Cordia    Chief Complaint: dizziness/dysphagia left facial droop  History obtained from:  Patient   HPI:                                                                                                                                         Christian [name] is an 74 y.o. male  With [facility] HLD, CKD 3, tobacco abuse who presented to [facility] ED as a code stroke for dizziness and left facial droop.  Patient stated that he woke up this morning in his usual state of health. [place] farmers [place] and at [employer] he had a sudden onset of dizziness ( diplopia) when he bent down to take a picture. Per EMS he was very diaphoretic when they arrived. Patient denies CP, focal weakness, or HA, blood thinning medications.   ED course:  CTA: was negative for LVO CTH: did not show a hemorrhage Medium volume emesis in CT BP: 158/90 BG: 105   Date last known well: 05/07/19 Time last known well: 1354 tPA Given: no; too mild to treat ( stays in window until 6pm) Modified Rankin: Rankin Score=0 NIHSS:2 ( dysarthria, left facial droop)   Past Medical History:  Diagnosis Date  . Adenomatous colon polyp   . Allergy    seasonal  . CKD (chronic kidney disease) stage 3, GFR 30-59 ml/min   . Hyperlipidemia   . Hypertriglyceridemia   . PVD (peripheral vascular disease) (HCC)    decreased TBI bilaterally  . Smoker     Past Surgical History:  Procedure Laterality Date  . CHOLECYSTECTOMY  1987  . EYE SURGERY     lasik    Family History  Problem Relation Age of Onset  . Hypertension Mother   . Stroke Mother   . Diabetes Mother        borderline  . Arthritis Father   . ALS Son   . Colon cancer Neg Hx   . Esophageal cancer Neg Hx   . Rectal cancer Neg Hx   . Stomach cancer Neg Hx       Social History:  reports that he has been smoking cigarettes. He has been smoking about 0.25 packs per day. He has  never used smokeless tobacco. He reports current alcohol use of about 14.0 standard drinks of alcohol per week. He reports that he does not use drugs.  Allergies: No Known Allergies  Medications:  Current Facility-Administered Medications  Medication Dose Route Frequency Provider Last Rate Last Admin  . 0.9 %  sodium chloride infusion  500 mL Intravenous Continuous Nelida Meuse III, MD      . ondansetron [facility]) injection 4 mg  4 mg Intravenous Once Marguarite Markov R, MD      . sodium chloride flush (NS) 0.9 % injection 3 mL  3 mL Intravenous Once Lajean Saver, MD       Current Outpatient Medications  Medication Sig Dispense Refill  . aspirin EC 81 MG tablet Take 81 mg by mouth daily.    Marland Kitchen atorvastatin (LIPITOR) 40 MG tablet TAKE 1 TABLET BY MOUTH EVERY DAY 90 tablet 3  . cholecalciferol (VITAMIN D) 1000 UNITS tablet Take 1,000 Units by mouth daily.    . diazepam (VALIUM) 5 MG tablet Take 1 tablet (5 mg total) by mouth every 12 (twelve) hours as needed for anxiety. 30 tablet 1  . fenofibrate 160 MG tablet TAKE 1 TABLET BY MOUTH EVERY DAY 90 tablet 3  . fluticasone (FLONASE) 50 MCG/ACT nasal spray PLACE 2 SPRAYS INTO EACH NOSTRIL DAILY 48 mL 2  . loratadine (CLARITIN) 10 MG tablet Take 10 mg by mouth daily.    . Multiple Vitamins-Minerals (MULTIVITAMIN WITH MINERALS) tablet Take 1 tablet by mouth daily.    . vitamin C (ASCORBIC ACID) 500 MG tablet Take 500 mg by mouth daily.       ROS:                                                                                                                                       ROS was performed and is negative except as noted in HPI    General Examination:                                                                                                      There were no vitals taken for this visit.  Physical Exam   Constitutional: Appears well-developed and well-nourished.  Psych: Affect appropriate to situation Eyes: Normal external eye and conjunctiva. HENT: Normocephalic, no lesions, without obvious abnormality.   Musculoskeletal-no joint tenderness, deformity or swelling Cardiovascular: Normal rate and regular rhythm.  Respiratory: Effort normal, non-labored breathing saturations WNL GI: Soft.  No distension. There is no tenderness.  Skin: Warm/ diaphoretic/intact  Neurological Examination Mental Status: Alert, oriented, thought content appropriate.  Dysarthria mild. Speech fluent without evidence of aphasia.  Able to follow  commands without difficulty. Cranial  Nerves: II:  Visual fields grossly normal,  III,IV, VI: ptosis not present, extra-ocular motions intact bilaterally, pupils equal, round, reactive to light and accommodation V,VII: smile symmetric, facial light touch sensation normal bilaterally VIII: hearing normal bilaterally IX,X: uvula rises midline XI: bilateral shoulder shrug XII: midline tongue extension Motor: Right : Upper extremity   5/5 Left:     Upper extremity   5/5  Lower extremity   5/5  Lower extremity   5/5 Tone and bulk:normal tone throughout; no atrophy noted Sensory: light touch intact throughout, bilaterally Deep Tendon Reflexes: 2+ and symmetric biceps and patella Cerebellar: No ataxia noted Gait: wide stance, but able to walk   Lab Results: Basic Metabolic Panel: Recent Labs  Lab 05/07/19 1451  NA 138  K 3.6  CL 105  GLUCOSE 116*  BUN 21  CREATININE 1.10    CBC: Recent Labs  Lab 05/07/19 1449 05/07/19 1451  WBC 11.5*  --   NEUTROABS 5.4  --   HGB 15.4 15.6  HCT 46.6 46.0  MCV 91.2  --   PLT 302  --    CBG: Recent Labs  Lab 05/07/19 1446  GLUCAP 113*    Imaging: CT HEAD CODE STROKE WO CONTRAST  Result Date: 05/07/2019 CLINICAL DATA:  Code stroke. Facial droop dizziness nausea vomiting. Dysarthria. EXAM: CT HEAD WITHOUT CONTRAST  TECHNIQUE: Contiguous axial images were obtained from the base of the skull through the vertex without intravenous contrast. COMPARISON:  None. FINDINGS: Brain: Mild atrophy and mild chronic microvascular ischemic changes in the white matter. No acute cortical infarct. Negative for hemorrhage or mass. Vascular: Possible hyperdense right M2 branch in the sylvian fissure. Correlate with CTA findings. Skull: Negative Sinuses/Orbits: Mucosal edema paranasal sinuses.  Negative orbit. Other: None ASPECTS ([place] Stroke Program Early CT Score) - Ganglionic level infarction (caudate, lentiform nuclei, internal capsule, insula, M1-M3 cortex): 7 - Supraganglionic infarction (M4-M6 cortex): 3 Total score (0-10 with 10 being normal): 10 IMPRESSION: 1. No acute abnormality. Atrophy and chronic microvascular ischemia. 2. ASPECTS is 10 3. Possible hyperdense right M2 segment in the sylvian fissure. Correlate with CTA findings. 4. Preliminary report texted to Dr. Lorraine Lax on  She[facility] Electronically Signed   By: Franchot Gallo M.D.   On: 05/07/2019 15:09       Laurey Morale, MSN, NP-C [facility] [phone]  05/07/2019, 3:15 PM    Assessment: 74 y.o. male with HLD who presented as a code stroke for sudden onset double vision, dysphagia. Patient was not given TPA d/t low NIHSS, and the CTA head was negative for LVO. Likely posterior circulation infarct. Will obtain MRI brain On initial examination patient had a left facial droop and dysarthria. The facial droop did resolve while in CT.  Stroke Risk Factors - hyperlipidemia and smoking  Acute ischemic stroke Acute right vertebral artery occlusion/possible dissection    Recommendations: --MRI brain  Addendum: Reviewed MRI: patient has Acute infarct in the right cerebellar tonsil, right cerebellum, and left pons. Small acute infarct in the left parietal lobe. Complete stroke work-up recommended as below:  -- BP goal : Permissive HTN upto 220/110  mmHg (for 24-48 hours then goal is normotensive gradually. --Echocardiogram -- ASA 81mg  and plavix 75mg  after 300mg  Plavix  Load, continue for 3 months  -- continue atorvastatin 40 mg daily -- HgbA1c, fasting lipid panel -- PT consult, OT consult, Speech consult --Telemetry monitoring --Frequent neuro checks --Stroke swallow screen      Attending physician note to follow with Assessment and  plan . --Please page the Stroke team from 8am-4pm.   You can look them up on [email or web address]      NEUROHOSPITALIST ADDENDUM Performed a face to face diagnostic evaluation.   I have reviewed the contents of history and physical exam as documented by PA/ARNP/Resident and agree with above documentation.  I have discussed and formulated the above plan as documented. Edits to the note have been made as needed.  Mr. Laidig is a 74 year old male with history of hyperlipidemia, tobacco abuse, CKD stage III presenting with sudden onset dizziness, double vision and left facial droop that began at 1:54 PM while [place] [employer] [place]-brought to the emergency department as a code stroke.   On arrival, he was noted to have facial droop and mild left arm ataxia and was vomiting. Symptoms improved shortly after while undergoing CT head-no longer had facial droop or ataxia.  Scored NIHSS of 1 for mild dysarthria.  CT angiogram showed right vertebral artery occlusion-extending intracranially.  Radiology felt that this is possibly dissection, patient denies any neck trauma, sudden bending of the neck.  Not a candidate for TPA as symptoms were mild as well as high risk of bleeding due to intracranial vertebral artery occlusion/possible dissection extending intracranially.  MRI brain confirmed stroke in the right cerebellum with embolized in the left pons.  Size of infarct or small and patient would benefit from dual antiplatelet therapy and to 3 months due to acute occlusion.  Remaining recommendations as  above.     Karena Addison Jeramiah Mccaughey MD [facility] [phone]   If 7pm to 7am, please call on call as listed on AMION.

## 2019-05-07 NOTE — H&P (Signed)
History and Physical    Christian [name] [id number] DOB: 1945-12-03 DOA: 05/07/2019  Referring MD/NP/PA: Irene Pap PA PCP: Susy Frizzle, MD   Patient coming from: Home  Chief Complaint: Slurred speech  HPI: Christian [name] is a 74 y.o. male with medical history significant of hyperlipidemia, PVD, tobacco abuse who presented to the hospital with sudden onset dizziness and nausea this afternoon.  His wife is at bedside and assist with history as well.  Patient was out walking with his wife and bent over to take a picture and upon sitting up he developed significant nausea and diaphoresis.  EMS was called and he was brought to the ED for further evaluation.  He had noted left facial droop as well as slurred speech.  He denies any similar issues in the past.  He admits to nausea and an episode of vomiting while in the ED however prior to this had been well and was spending time with his wife and friends the previous night.  He denies any focal weakness, chest pain, shortness of breath, fever, chills, neck pain, dysuria.  ED Course: In the ED the patient was worked up for possible CVA.  He had a CT that was negative followed by a CTA that was concerning for right vertebral occlusion.  MRI was then performed revealing a small acute infarct in the right cerebellar tonsil, right cerebellum, and the left pons and a small infarct in the left parietal lobe.  Neurology evaluated the patient recommending complete stroke work-up.  Patient will be admitted to [facility] with observation status for CVA.  Review of Systems: As per HPI otherwise 10 point review of systems negative.   Past Medical History:  Diagnosis Date  . Adenomatous colon polyp   . Allergy    seasonal  . CKD (chronic kidney disease) stage 3, GFR 30-59 ml/min   . Hyperlipidemia   . Hypertriglyceridemia   . PVD (peripheral vascular disease) (HCC)    decreased TBI bilaterally  . Smoker     Past Surgical History:  Procedure Laterality  Date  . CHOLECYSTECTOMY  1987  . EYE SURGERY     lasik     reports that he has been smoking cigarettes. He has been smoking about 0.25 packs per day. He has never used smokeless tobacco. He reports current alcohol use of about 14.0 standard drinks of alcohol per week. He reports that he does not use drugs.  No Known Allergies  Family History  Problem Relation Age of Onset  . Hypertension Mother   . Stroke Mother   . Diabetes Mother        borderline  . Arthritis Father   . ALS Son   . Colon cancer Neg Hx   . Esophageal cancer Neg Hx   . Rectal cancer Neg Hx   . Stomach cancer Neg Hx     Prior to Admission medications   Medication Sig Start Date End Date Taking? Authorizing Provider  aspirin EC 81 MG tablet Take 81 mg by mouth daily.   Yes [provider]  atorvastatin (LIPITOR) 40 MG tablet TAKE 1 TABLET BY MOUTH EVERY DAY Patient taking differently: Take 40 mg by mouth daily.  08/16/18  Yes Susy Frizzle, MD  cholecalciferol (VITAMIN D) 1000 UNITS tablet Take 1,000 Units by mouth daily.   Yes [provider]  diazepam (VALIUM) 5 MG tablet Take 1 tablet (5 mg total) by mouth every 12 (twelve) hours as needed for anxiety. 02/10/19  Yes Pickard,  Cammie Mcgee, MD  fluticasone (FLONASE) 50 MCG/ACT nasal spray PLACE 2 SPRAYS INTO EACH NOSTRIL DAILY Patient taking differently: Place 2 sprays into both nostrils daily.  12/24/18  Yes Susy Frizzle, MD  loratadine (CLARITIN) 10 MG tablet Take 10 mg by mouth daily.   Yes [provider]  Multiple Vitamins-Minerals (MULTIVITAMIN WITH MINERALS) tablet Take 1 tablet by mouth daily.   Yes [provider]  vitamin C (ASCORBIC ACID) 500 MG tablet Take 500 mg by mouth daily.   Yes [provider]  fenofibrate 160 MG tablet TAKE 1 TABLET BY MOUTH EVERY DAY Patient not taking: Reported on 05/07/2019 08/30/18   Susy Frizzle, MD    Physical Exam: Vitals:   05/07/19 1830 05/07/19 2120 05/07/19 2128  05/07/19 2131  BP: (!) 147/87 (!) 169/87  (!) 153/80  Pulse: 69 72 73 69  Resp: 16  16 (!) 21  Temp:      TempSrc:      SpO2: 99% 96% 96% 95%  Weight:          Constitutional: NAD, calm, comfortable Vitals:   05/07/19 1830 05/07/19 2120 05/07/19 2128 05/07/19 2131  BP: (!) 147/87 (!) 169/87  (!) 153/80  Pulse: 69 72 73 69  Resp: 16  16 (!) 21  Temp:      TempSrc:      SpO2: 99% 96% 96% 95%  Weight:       Eyes: PERRL, lids and conjunctivae normal ENMT: Mucous membranes are moist. Posterior pharynx clear of any exudate or lesions Neck: normal, supple, no masses Respiratory: clear to auscultation bilaterally, no wheezing, no crackles. Normal respiratory effort. No accessory muscle use.  Cardiovascular: Regular rate and rhythm, no murmurs / rubs / gallops. No extremity edema. 2+ pedal pulses.  Abdomen: no tenderness, no masses palpated. No hepatosplenomegaly. Bowel sounds positive.  Musculoskeletal: no clubbing / cyanosis. No joint deformity upper and lower extremities. Good ROM, no contractures. Normal muscle tone.  Skin: no rashes, lesions, ulcers. No induration Neurologic: CN 2-12 grossly intact. Sensation intact.Strength 5/5 in all 4.  No facial droop or slurred speech noted Psychiatric: Normal judgment and insight. Alert and oriented x 3. Normal mood.   Labs on Admission: I have personally reviewed following labs and imaging studies  CBC: Recent Labs  Lab 05/07/19 1449 05/07/19 1451  WBC 11.5*  --   NEUTROABS 5.4  --   HGB 15.4 15.6  HCT 46.6 46.0  MCV 91.2  --   PLT 302  --    Basic Metabolic Panel: Recent Labs  Lab 05/07/19 1449 05/07/19 1451  NA 137 138  K 3.7 3.6  CL 103 105  CO2 19*  --   GLUCOSE 122* 116*  BUN 20 21  CREATININE 1.19 1.10  CALCIUM 9.1  --    GFR: CrCl cannot be calculated (Unknown ideal weight.). Liver Function Tests: Recent Labs  Lab 05/07/19 1449  AST 29  ALT 39  ALKPHOS 120  BILITOT 0.9  PROT 6.5  ALBUMIN 3.9   No  results for input(s): LIPASE, AMYLASE in the last 168 hours. No results for input(s): AMMONIA in the last 168 hours. Coagulation Profile: Recent Labs  Lab 05/07/19 1449  INR 1.0   Cardiac Enzymes: No results for input(s): CKTOTAL, CKMB, CKMBINDEX, TROPONINI in the last 168 hours. BNP (last 3 results) No results for input(s): PROBNP in the last 8760 hours. HbA1C: No results for input(s): HGBA1C in the last 72 hours. CBG: Recent Labs  Lab 05/07/19 1446  GLUCAP 113*   Lipid Profile: No results for input(s): CHOL, HDL, LDLCALC, TRIG, CHOLHDL, LDLDIRECT in the last 72 hours. Thyroid Function Tests: No results for input(s): TSH, T4TOTAL, FREET4, T3FREE, THYROIDAB in the last 72 hours. Anemia Panel: No results for input(s): VITAMINB12, FOLATE, FERRITIN, TIBC, IRON, RETICCTPCT in the last 72 hours. Urine analysis: No results found for: COLORURINE, APPEARANCEUR, LABSPEC, PHURINE, GLUCOSEU, HGBUR, BILIRUBINUR, KETONESUR, PROTEINUR, UROBILINOGEN, NITRITE, LEUKOCYTESUR Sepsis Labs: @LABRCNTIP (procalcitonin:4,lacticidven:4) ) Recent Results (from the past 240 hour(s))  Respiratory Panel by RT PCR (Flu A&B, Covid) - Nasopharyngeal Swab     Status: None   Collection Time: 05/07/19  4:57 PM   Specimen: Nasopharyngeal Swab  Result Value Ref Range Status   SARS Coronavirus 2 by RT PCR NEGATIVE NEGATIVE Final    Comment: (NOTE) SARS-CoV-2 target nucleic acids are NOT DETECTED. The SARS-CoV-2 RNA is generally detectable in upper respiratoy specimens during the acute phase of infection. The lowest concentration of SARS-CoV-2 viral copies this assay can detect is 131 copies/mL. A negative result does not preclude SARS-Cov-2 infection and should not be used as the sole basis for treatment or other patient management decisions. A negative result may occur with  improper specimen collection/handling, submission of specimen other than nasopharyngeal swab, presence of viral mutation(s) within  the areas targeted by this assay, and inadequate number of viral copies (<131 copies/mL). A negative result must be combined with clinical observations, patient history, and epidemiological information. The expected result is Negative. Fact Sheet for Patients:  PinkCheek.be Fact Sheet for Healthcare Providers:  GravelBags.it This test is not yet ap proved or cleared by the [place] FDA and  has been authorized for detection and/or diagnosis of SARS-CoV-2 by FDA under an Emergency Use Authorization (EUA). This EUA will remain  in effect (meaning this test can be used) for the duration of the COVID-19 declaration under Section 564(b)(1) of the Act, 21 U.S.C. section 360bbb-3(b)(1), unless the authorization is terminated or revoked sooner.    Influenza A by PCR NEGATIVE NEGATIVE Final   Influenza B by PCR NEGATIVE NEGATIVE Final    Comment: (NOTE) The Xpert Xpress SARS-CoV-2/FLU/RSV assay is intended as an aid in  the diagnosis of influenza from Nasopharyngeal swab specimens and  should not be used as a sole basis for treatment. Nasal washings and  aspirates are unacceptable for Xpert Xpress SARS-CoV-2/FLU/RSV  testing. Fact Sheet for Patients: PinkCheek.be Fact Sheet for Healthcare Providers: GravelBags.it This test is not yet approved or cleared by the [place] FDA and  has been authorized for detection and/or diagnosis of SARS-CoV-2 by  FDA under an Emergency Use Authorization (EUA). This EUA will remain  in effect (meaning this test can be used) for the duration of the  Covid-19 declaration under Section 564(b)(1) of the Act, 21  U.S.C. section 360bbb-3(b)(1), unless the authorization is  terminated or revoked. Performed at [facility], [place] [address] [place], [place] [address]      Radiological Exams on Admission: CT Code Stroke CTA Head W/WO  contrast  Result Date: 05/07/2019 CLINICAL DATA:  Acute neuro deficit. Dysarthria, dizziness, facial droop, nausea and vomiting EXAM: CT ANGIOGRAPHY HEAD AND NECK TECHNIQUE: Multidetector CT imaging of the head and neck was performed using the standard protocol during bolus administration of intravenous contrast. Multiplanar CT image reconstructions and MIPs were obtained to evaluate the vascular anatomy. Carotid stenosis measurements (when applicable) are obtained utilizing NASCET criteria, using the distal internal carotid diameter as the  denominator. CONTRAST:  23mL OMNIPAQUE IOHEXOL 350 MG/ML SOLN COMPARISON:  CT head 05/07/2019 FINDINGS: CTA NECK FINDINGS Aortic arch: Standard branching. Proximal great vessels widely patent. Mild atherosclerotic calcification aortic arch. Right carotid system: Mild atherosclerotic calcification right carotid bifurcation without significant stenosis. Left carotid system: Atherosclerotic calcification left carotid bulb without significant stenosis. Vertebral arteries: Left vertebral dominant. Left vertebral artery widely patent to the basilar without stenosis. Right vertebral artery is patent to the C1 level where there is tapered occlusion with ill-defined margins suggesting acute thrombus. No atherosclerotic disease at this level. Skeleton: Cervical spondylosis.  No acute skeletal abnormality. Other neck: Negative Upper chest: Lung apices clear bilaterally. Review of the MIP images confirms the above findings CTA HEAD FINDINGS Anterior circulation: Cavernous carotid widely patent bilaterally without stenosis. Anterior and middle cerebral arteries widely patent bilaterally. Hyperdense right M1 segment on the CT does not correlate with large vessel occlusion. Posterior circulation: Occlusion of the distal right vertebral artery at the C1 level. This is likely acute with ill-defined margins. There is reconstitution of the very distal portion of the right vertebral artery. Left  vertebral artery is patent to the basilar. PICA patent bilaterally. Basilar widely patent. Superior cerebellar and posterior cerebral arteries patent bilaterally without stenosis. Venous sinuses: Normal venous enhancement. Anatomic variants: None Review of the MIP images confirms the above findings IMPRESSION: 1. Occlusion of the distal right vertebral artery at the C1 level through the V4 level. This has ill-defined margins and likely is acute either due dissection or less likely atherosclerotic disease. No significant atherosclerotic calcification is seen in the area. 2. Mild atherosclerotic disease carotid bifurcation bilaterally without stenosis. Left vertebral artery patent to the basilar without stenosis. 3. No evidence of right M2 large vessel occlusion. Electronically Signed   By: Franchot Gallo M.D.   On: 05/07/2019 15:29   CT Code Stroke CTA Neck W/WO contrast  Result Date: 05/07/2019 CLINICAL DATA:  Acute neuro deficit. Dysarthria, dizziness, facial droop, nausea and vomiting EXAM: CT ANGIOGRAPHY HEAD AND NECK TECHNIQUE: Multidetector CT imaging of the head and neck was performed using the standard protocol during bolus administration of intravenous contrast. Multiplanar CT image reconstructions and MIPs were obtained to evaluate the vascular anatomy. Carotid stenosis measurements (when applicable) are obtained utilizing NASCET criteria, using the distal internal carotid diameter as the denominator. CONTRAST:  75mL OMNIPAQUE IOHEXOL 350 MG/ML SOLN COMPARISON:  CT head 05/07/2019 FINDINGS: CTA NECK FINDINGS Aortic arch: Standard branching. Proximal great vessels widely patent. Mild atherosclerotic calcification aortic arch. Right carotid system: Mild atherosclerotic calcification right carotid bifurcation without significant stenosis. Left carotid system: Atherosclerotic calcification left carotid bulb without significant stenosis. Vertebral arteries: Left vertebral dominant. Left vertebral artery  widely patent to the basilar without stenosis. Right vertebral artery is patent to the C1 level where there is tapered occlusion with ill-defined margins suggesting acute thrombus. No atherosclerotic disease at this level. Skeleton: Cervical spondylosis.  No acute skeletal abnormality. Other neck: Negative Upper chest: Lung apices clear bilaterally. Review of the MIP images confirms the above findings CTA HEAD FINDINGS Anterior circulation: Cavernous carotid widely patent bilaterally without stenosis. Anterior and middle cerebral arteries widely patent bilaterally. Hyperdense right M1 segment on the CT does not correlate with large vessel occlusion. Posterior circulation: Occlusion of the distal right vertebral artery at the C1 level. This is likely acute with ill-defined margins. There is reconstitution of the very distal portion of the right vertebral artery. Left vertebral artery is patent to the basilar. PICA patent bilaterally. Basilar  widely patent. Superior cerebellar and posterior cerebral arteries patent bilaterally without stenosis. Venous sinuses: Normal venous enhancement. Anatomic variants: None Review of the MIP images confirms the above findings IMPRESSION: 1. Occlusion of the distal right vertebral artery at the C1 level through the V4 level. This has ill-defined margins and likely is acute either due dissection or less likely atherosclerotic disease. No significant atherosclerotic calcification is seen in the area. 2. Mild atherosclerotic disease carotid bifurcation bilaterally without stenosis. Left vertebral artery patent to the basilar without stenosis. 3. No evidence of right M2 large vessel occlusion. Electronically Signed   By: Franchot Gallo M.D.   On: 05/07/2019 15:29   MR BRAIN WO CONTRAST  Result Date: 05/07/2019 CLINICAL DATA:  Stroke. EXAM: MRI HEAD WITHOUT CONTRAST TECHNIQUE: Multiplanar, multiecho pulse sequences of the brain and surrounding structures were obtained without  intravenous contrast. COMPARISON:  CT angio head neck today FINDINGS: Brain: Acute infarct in the right cerebellar tonsil, right posterior cerebellum, and left pons. In addition, there is a small acute infarct in the left parietal lobe. Negative for hemorrhage or mass. Ventricle size normal. Scattered small white matter hyperintensities compatible with mild chronic microvascular ischemia. Chronic ischemic change in the pons left greater than right. Vascular: Occlusion distal right vertebral artery with hyperintense thrombus as noted on CTA. Distal left vertebral artery and basilar artery and both internal carotid artery showed normal arterial flow voids. Skull and upper cervical spine: No focal skeletal lesion. Sinuses/Orbits: Mucosal edema paranasal sinuses.  Negative orbit Other: None IMPRESSION: Acute infarct in the right cerebellar tonsil, right cerebellum, and left pons. Small acute infarct in the left parietal lobe. Occlusion distal right vertebral artery which appears acute on CTA. Electronically Signed   By: Franchot Gallo M.D.   On: 05/07/2019 16:45   CT HEAD CODE STROKE WO CONTRAST  Result Date: 05/07/2019 CLINICAL DATA:  Code stroke. Facial droop dizziness nausea vomiting. Dysarthria. EXAM: CT HEAD WITHOUT CONTRAST TECHNIQUE: Contiguous axial images were obtained from the base of the skull through the vertex without intravenous contrast. COMPARISON:  None. FINDINGS: Brain: Mild atrophy and mild chronic microvascular ischemic changes in the white matter. No acute cortical infarct. Negative for hemorrhage or mass. Vascular: Possible hyperdense right M2 branch in the sylvian fissure. Correlate with CTA findings. Skull: Negative Sinuses/Orbits: Mucosal edema paranasal sinuses.  Negative orbit. Other: None ASPECTS ([place] Stroke Program Early CT Score) - Ganglionic level infarction (caudate, lentiform nuclei, internal capsule, insula, M1-M3 cortex): 7 - Supraganglionic infarction (M4-M6 cortex): 3 Total  score (0-10 with 10 being normal): 10 IMPRESSION: 1. No acute abnormality. Atrophy and chronic microvascular ischemia. 2. ASPECTS is 10 3. Possible hyperdense right M2 segment in the sylvian fissure. Correlate with CTA findings. 4. Preliminary report texted to Dr. Lorraine Lax on  Shea Evans Electronically Signed   By: Franchot Gallo M.D.   On: 05/07/2019 15:09    EKG: Pending.   Assessment/Plan Active Problems:   Hyperlipidemia   Smoker   CKD (chronic kidney disease) stage 3, GFR 30-59 ml/min   CVA (cerebral vascular accident) ([place])   Stroke (cerebrum) ([place])  #CVA -Patient with CVA on MRI. -Aspirin and Plavix per neurology. -Lipid panel and hemoglobin A1c in the morning. -TTE for further evaluation.  PT/OT/speech consult.  Will advance to regular diet pending stroke swallow screen. -Frequent neuro checks as well as telemetry monitoring overnight.  #Hyperlipidemia -Lipid panel in the morning. -Continue Lipitor.  #CKD -Repeat BMP in the morning.  #Tobacco abuse -Counseled on importance of tobacco  cessation.   DVT prophylaxis: heparin Code Status: Full Family Communication: Wife at bedside Disposition Plan: Home in 24-48 hours pending clinical improvement Consults called: Neurology Admission status: Obs   Arlan Organ DO [facility]   05/07/2019, 9:38 PM

## 2019-05-07 NOTE — ED Notes (Signed)
Pt to MRI

## 2019-05-07 NOTE — ED Triage Notes (Signed)
Patient arrives via ems with c/o of new onset stroke symptoms. Per ems, the patient was out shopping when he became diaphoretic, dizzy, and nauseous. Patien also noted that he was having left sided facial droop and weakness with ems. Patient was also having some dysphagia en route.   Patient arrives with 18g x2 in bilateral AC's.   Patient does have a history of hyperlipidemia that he takes atorvastatin for.   Vitals with ems were 158/90, HR: 61, 105 CBG

## 2019-05-08 ENCOUNTER — Other Ambulatory Visit: Payer: Self-pay

## 2019-05-08 ENCOUNTER — Observation Stay (HOSPITAL_COMMUNITY): Payer: Medicare Other

## 2019-05-08 ENCOUNTER — Encounter (HOSPITAL_COMMUNITY): Payer: Self-pay | Admitting: Internal Medicine

## 2019-05-08 DIAGNOSIS — I6389 Other cerebral infarction: Secondary | ICD-10-CM

## 2019-05-08 DIAGNOSIS — G8194 Hemiplegia, unspecified affecting left nondominant side: Secondary | ICD-10-CM | POA: Diagnosis present

## 2019-05-08 DIAGNOSIS — Z823 Family history of stroke: Secondary | ICD-10-CM | POA: Diagnosis not present

## 2019-05-08 DIAGNOSIS — R4781 Slurred speech: Secondary | ICD-10-CM | POA: Diagnosis present

## 2019-05-08 DIAGNOSIS — I6340 Cerebral infarction due to embolism of unspecified cerebral artery: Secondary | ICD-10-CM

## 2019-05-08 DIAGNOSIS — Z9049 Acquired absence of other specified parts of digestive tract: Secondary | ICD-10-CM | POA: Diagnosis not present

## 2019-05-08 DIAGNOSIS — I639 Cerebral infarction, unspecified: Secondary | ICD-10-CM | POA: Diagnosis not present

## 2019-05-08 DIAGNOSIS — E785 Hyperlipidemia, unspecified: Secondary | ICD-10-CM | POA: Diagnosis present

## 2019-05-08 DIAGNOSIS — R739 Hyperglycemia, unspecified: Secondary | ICD-10-CM | POA: Diagnosis present

## 2019-05-08 DIAGNOSIS — Z20822 Contact with and (suspected) exposure to covid-19: Secondary | ICD-10-CM | POA: Diagnosis present

## 2019-05-08 DIAGNOSIS — E7800 Pure hypercholesterolemia, unspecified: Secondary | ICD-10-CM | POA: Diagnosis not present

## 2019-05-08 DIAGNOSIS — R29898 Other symptoms and signs involving the musculoskeletal system: Secondary | ICD-10-CM | POA: Diagnosis not present

## 2019-05-08 DIAGNOSIS — Z79899 Other long term (current) drug therapy: Secondary | ICD-10-CM | POA: Diagnosis not present

## 2019-05-08 DIAGNOSIS — R29810 Facial weakness: Secondary | ICD-10-CM | POA: Diagnosis present

## 2019-05-08 DIAGNOSIS — R1310 Dysphagia, unspecified: Secondary | ICD-10-CM | POA: Diagnosis present

## 2019-05-08 DIAGNOSIS — I63113 Cerebral infarction due to embolism of bilateral vertebral arteries: Secondary | ICD-10-CM | POA: Diagnosis present

## 2019-05-08 DIAGNOSIS — N183 Chronic kidney disease, stage 3 unspecified: Secondary | ICD-10-CM | POA: Diagnosis present

## 2019-05-08 DIAGNOSIS — F17210 Nicotine dependence, cigarettes, uncomplicated: Secondary | ICD-10-CM | POA: Diagnosis present

## 2019-05-08 DIAGNOSIS — Z7982 Long term (current) use of aspirin: Secondary | ICD-10-CM | POA: Diagnosis not present

## 2019-05-08 DIAGNOSIS — N1831 Chronic kidney disease, stage 3a: Secondary | ICD-10-CM | POA: Diagnosis not present

## 2019-05-08 DIAGNOSIS — F17200 Nicotine dependence, unspecified, uncomplicated: Secondary | ICD-10-CM | POA: Diagnosis not present

## 2019-05-08 DIAGNOSIS — R29702 NIHSS score 2: Secondary | ICD-10-CM | POA: Diagnosis present

## 2019-05-08 DIAGNOSIS — I129 Hypertensive chronic kidney disease with stage 1 through stage 4 chronic kidney disease, or unspecified chronic kidney disease: Secondary | ICD-10-CM | POA: Diagnosis present

## 2019-05-08 LAB — BASIC METABOLIC PANEL
Anion gap: 10 (ref 5–15)
BUN: 19 mg/dL (ref 8–23)
CO2: 23 mmol/L (ref 22–32)
Calcium: 9 mg/dL (ref 8.9–10.3)
Chloride: 105 mmol/L (ref 98–111)
Creatinine, Ser: 1.12 mg/dL (ref 0.61–1.24)
GFR calc Af Amer: >60 mL/min (ref >60)
GFR calc non Af Amer: >60 mL/min (ref >60)
Glucose, Bld: 120 mg/dL — ABNORMAL HIGH (ref 70–99)
Potassium: 4.1 mmol/L (ref 3.5–5.1)
Sodium: 138 mmol/L (ref 135–145)

## 2019-05-08 LAB — URINALYSIS, ROUTINE W REFLEX MICROSCOPIC
Bacteria, UA: NONE SEEN
Bilirubin Urine: NEGATIVE
Glucose, UA: NEGATIVE mg/dL
Hgb urine dipstick: NEGATIVE
Ketones, ur: NEGATIVE mg/dL
Nitrite: NEGATIVE
Protein, ur: NEGATIVE mg/dL
Specific Gravity, Urine: 1.009 (ref 1.005–1.030)
pH: 7 (ref 5.0–8.0)

## 2019-05-08 LAB — ECHOCARDIOGRAM COMPLETE
Height: 69 in
Weight: 2627.88 oz

## 2019-05-08 LAB — HEMOGLOBIN A1C
Hgb A1c MFr Bld: 5.9 % — ABNORMAL HIGH (ref 4.8–5.6)
Mean Plasma Glucose: 122.63 mg/dL

## 2019-05-08 LAB — LIPID PANEL
Cholesterol: 144 mg/dL (ref 0–200)
HDL: 59 mg/dL (ref >40)
LDL Cholesterol: 62 mg/dL (ref 0–99)
Total CHOL/HDL Ratio: 2.4 RATIO
Triglycerides: 117 mg/dL (ref <150)
VLDL: 23 mg/dL (ref 0–40)

## 2019-05-08 MED ORDER — ASPIRIN EC 325 MG PO TBEC
325.0000 mg | DELAYED_RELEASE_TABLET | Freq: Every day | ORAL | Status: DC
  Administered 2019-05-09: 325 mg via ORAL
  Filled 2019-05-08: qty 1

## 2019-05-08 NOTE — Progress Notes (Signed)
Occupational Therapy Evaluation Patient Details Name: Christian [name] MRN: [id number] DOB: 18-Jun-1945 Today's Date: 05/08/2019    History of Present Illness  74 y.o. male with medical history significant of hyperlipidemia, PVD, tobacco abuse, CKD stage 3 who presented to the hospital with sudden onset dizziness and nausea this afternoon. He had noted left facial droop as well as slurred speech. MRI was then performed revealing a small acute infarct in the right cerebellar tonsil, right cerebellum, and the left pons and a small infarct in the left parietal lobe.    Clinical Impression   PTA pt lived with his wife, independent in all ADL, IADL, and mobility tasks. Pt does not ambulate with an assistive device and reports 0 falls in the last 6 months. Pt still drives. Pt currently independent to min guard for self-care and functional transfer tasks. Pt able to ambulate around room and to/from bathroom with min guard and without use of an AD. Noted 2 instances of left lateral LOB with pt able to self-correct. Pt completed toileting task, walk-in shower transfer, and grooming at the sink with min guard. Educated pt on activity pacing and fall prevention strategies to increase balance with fair understanding. Pt demonstrates decreased endurance, balance, standing tolerance, and activity tolerance impacting ability to complete self-care and functional transfer tasks. Recommend skilled OT services to address above deficits in order to promote function and prevent further decline. Recommend [facility] OT for continued rehab following hospital discharge.     Follow Up Recommendations  Home health OT;Supervision/Assistance - 24 hour - for the first few days   Equipment Recommendations  3 in 1 bedside commode(for use in shower)    Recommendations for Other Services       Precautions / Restrictions Precautions Precautions: Fall Restrictions Weight Bearing Restrictions: No      Mobility Bed Mobility Overal bed  mobility: Needs Assistance Bed Mobility: Supine to Sit     Supine to sit: Supervision     General bed mobility comments: to ensure balance and safety  Transfers Overall transfer level: Needs assistance Equipment used: None Transfers: Sit to/from Stand Sit to Stand: Min guard         General transfer comment: To ensure balance and safety.     Balance Overall balance assessment: Needs assistance Sitting-balance support: Feet supported Sitting balance-Leahy Scale: Good       Standing balance-Leahy Scale: Fair                             ADL either performed or assessed with clinical judgement   ADL Overall ADL's : Needs assistance/impaired Eating/Feeding: Independent;Sitting   Grooming: Wash/dry hands;Wash/dry face;Oral care;Min guard;Standing Grooming Details (indicate cue type and reason): While standing at the sink Upper Body Bathing: Set up;Supervision/ safety;Sitting   Lower Body Bathing: Supervison/ safety;Min guard;Sit to/from stand   Upper Body Dressing : Set up;Supervision/safety;Sitting   Lower Body Dressing: Supervision/safety;Min guard;Sit to/from stand   Toilet Transfer: Min guard;Ambulation;Regular Toilet   Toileting- Clothing Manipulation and Hygiene: Supervision/safety;Min guard;Sit to/from stand   Tub/ Shower Transfer: Min guard;Walk-in shower;Grab bars   Functional mobility during ADLs: Min guard General ADL Comments: Pt able to ambulate around room and to/from bathroom without an assistive device. Noted 2 instances of left lateral LOB with pt able to self-correct.      Vision Baseline Vision/History: Wears glasses Wears Glasses: At all times Patient Visual Report: No change from baseline Vision Assessment?: Yes Eye Alignment: Within Functional  Limits Ocular Range of Motion: Within Functional Limits Alignment/Gaze Preference: Within Defined Limits Tracking/Visual Pursuits: Able to track stimulus in all quads without  difficulty Convergence: Within functional limits Visual Fields: No apparent deficits Additional Comments: Peripheral vision intact.      Perception     Praxis      Pertinent Vitals/Pain Pain Assessment: No/denies pain     Hand Dominance Right   Extremity/Trunk Assessment Upper Extremity Assessment Upper Extremity Assessment: Overall WFL for tasks assessed(BUE ROM WFL. Equal strength Sensation intact B Hand tremors )   Lower Extremity Assessment Lower Extremity Assessment: Defer to PT evaluation       Communication Communication Communication: No difficulties   Cognition Arousal/Alertness: Awake/alert Behavior During Therapy: WFL for tasks assessed/performed Overall Cognitive Status: Within Functional Limits for tasks assessed                                 General Comments: Pt pleasant and willing to participate in therapy tasks. Pt able to follow multi-step instructions without difficulty.    General Comments  No signs/symptoms of distress    Exercises     Shoulder Instructions      Home Living Family/patient expects to be discharged to:: Private residence Living Arrangements: Spouse/significant other Available Help at Discharge: Family Type of Home: House Home Access: Stairs to enter Technical brewer of Steps: 4   Home Layout: One level     Bathroom Shower/Tub: Occupational psychologist: Standard     Home Equipment: Environmental consultant - 4 wheels          Prior Functioning/Environment Level of Independence: Independent        Comments: Pt independent in all ADL, IADL, and mobility tasks. Pt does not ambulate with an assistive device and reports 0 falls in the last 6 months. Pt still drives.         OT Problem List: Decreased activity tolerance;Impaired balance (sitting and/or standing)      OT Treatment/Interventions: Self-care/ADL training;Therapeutic exercise;Neuromuscular education;Energy conservation;DME and/or AE  instruction;Therapeutic activities;Patient/family education;Balance training    OT Goals(Current goals can be found in the care plan section) Acute Rehab OT Goals Patient Stated Goal: to go home Time For Goal Achievement: 05/22/19 Potential to Achieve Goals: Good ADL Goals Pt Will Perform Tub/Shower Transfer: Shower transfer;with modified independence;grab bars Additional ADL Goal #1: Pt to complete all ADLs independently with 0 instances of LOB. Additional ADL Goal #2: Pt to complete higher level IADLs (i.e. bed making, item retrieval) with supervision and 0 instances of LOB. Additional ADL Goal #3: Pt to tolerate standing up to 10 min independently, in preparation for ADLs. Additional ADL Goal #4: Pt to recall and verbalize 3 fall prevention strategies with 0 verbal cues.  OT Frequency: Min 2X/week   Barriers to D/C:            Co-evaluation              AM-PAC OT "6 Clicks" Daily Activity     Outcome Measure Help from another person eating meals?: None Help from another person taking care of personal grooming?: A Little Help from another person toileting, which includes using toliet, bedpan, or urinal?: A Little Help from another person bathing (including washing, rinsing, drying)?: A Little Help from another person to put on and taking off regular upper body clothing?: A Little Help from another person to put on and taking off regular lower body  clothing?: A Little 6 Click Score: 19   End of Session Equipment Utilized During Treatment: Gait belt Nurse Communication: Mobility status  Activity Tolerance: Patient tolerated treatment well Patient left: in chair;with call bell/phone within reach;with chair alarm set  OT Visit Diagnosis: Unsteadiness on feet (R26.81)                Time: [id number] OT Time Calculation (min): 26 min Charges:  OT General Charges $OT Visit: 1 Visit OT Evaluation $OT Eval Low Complexity: 1 Low OT Treatments $Self Care/Home Management :  8-22 mins  Mauri Brooklyn OTR/L [phone]  Mauri Brooklyn 05/08/2019, 8:34 AM

## 2019-05-08 NOTE — Plan of Care (Signed)
  Problem: Self-Care: Goal: Ability to participate in self-care as condition permits will improve Outcome: Progressing   Problem: Education: Goal: Knowledge of patient specific risk factors addressed and post discharge goals established will improve Outcome: Progressing   Problem: Education: Goal: Knowledge of secondary prevention will improve Outcome: Progressing   

## 2019-05-08 NOTE — Progress Notes (Signed)
PT Cancellation Note  Patient Details Name: Dason Burgert MRN: [id number] DOB: 04-Nov-1945   Cancelled Treatment:    Reason Eval/Treat Not Completed: Patient at procedure or test/unavailable  Will re-attempt today as schedule permits.   Arby Barrette, PT Pager [phone]  Rexanne Mano 05/08/2019, 2:33 PM

## 2019-05-08 NOTE — ED Notes (Signed)
Report attempted 

## 2019-05-08 NOTE — Evaluation (Signed)
Physical Therapy Evaluation Patient Details Name: Christian [name] MRN: [id number] DOB: 1945-03-02 Today's Date: 05/08/2019   History of Present Illness   74 y.o. male with medical history significant of hyperlipidemia, PVD, tobacco abuse, CKD stage 3 who presented to the hospital with sudden onset dizziness and nausea this afternoon. He had noted left facial droop as well as slurred speech. MRI was then performed revealing a small acute infarct in the right cerebellar tonsil, right cerebellum, and the left pons and a small infarct in the left parietal lobe.   Clinical Impression   Pt admitted with above diagnosis. Patient independent PTA. Currently with decreased balance with tendency to drift or lean to his left. Patient is aware of deficit and self-corrects, yet cannot prevent the drift. Patient progressing/improving quickly and we discussed that he may not need OPPT by the time of his first appointment. In that case, he knows he may cancel the appointment. Pt currently with functional limitations due to the deficits listed below (see PT Problem List). Pt will benefit from skilled PT to increase their independence and safety with mobility to allow discharge to the venue listed below.       Follow Up Recommendations Outpatient PT;Supervision - Intermittent    Equipment Recommendations  None recommended by PT    Recommendations for Other Services       Precautions / Restrictions Precautions Precautions: Fall Restrictions Weight Bearing Restrictions: No      Mobility  Bed Mobility Overal bed mobility: Needs Assistance Bed Mobility: Supine to Sit     Supine to sit: Supervision     General bed mobility comments: to ensure balance and safety due to dizziness  Transfers Overall transfer level: Needs assistance Equipment used: None Transfers: Sit to/from Stand Sit to Stand: Min guard         General transfer comment: To ensure balance and safety.    Ambulation/Gait Ambulation/Gait assistance: Min guard Gait Distance (Feet): 300 Feet Assistive device: None Gait Pattern/deviations: Step-through pattern;Decreased stride length;Drifts right/left Gait velocity: slower than normal for him Gait velocity interpretation: 1.31 - 2.62 ft/sec, indicative of limited community ambulator General Gait Details: pt consistently drifts to his left and self-corrects; he is aware, but unable to prevent drift  Stairs Stairs: Yes Stairs assistance: Min guard Stair Management: Two rails;Alternating pattern;Forwards Number of Stairs: 5    Wheelchair Mobility    Modified Rankin (Stroke Patients Only) Modified Rankin (Stroke Patients Only) Pre-Morbid Rankin Score: No symptoms Modified Rankin: Moderately severe disability     Balance Overall balance assessment: Needs assistance Sitting-balance support: Feet supported Sitting balance-Leahy Scale: Good     Standing balance support: No upper extremity supported Standing balance-Leahy Scale: Fair   Single Leg Stance - Right Leg: 15 Single Leg Stance - Left Leg: 8     Rhomberg - Eyes Opened: 30 Rhomberg - Eyes Closed: 5(incr left lean with assist to maintain balance) High level balance activites: Turns;Sudden stops;Head turns High Level Balance Comments: pt with no LOB with turns, sudden stops; abel to incr velocity,however continues to drift left Standardized Balance Assessment Standardized Balance Assessment : Berg Balance Test Berg Balance Test Sit to Stand: Able to stand without using hands and stabilize independently Standing Unsupported: Able to stand safely 2 minutes Sitting with Back Unsupported but Feet Supported on Floor or Stool: Able to sit safely and securely 2 minutes Stand to Sit: Sits safely with minimal use of hands Transfers: Able to transfer safely, minor use of hands Standing Unsupported with Eyes Closed:  Able to stand 10 seconds safely Standing Ubsupported with Feet  Together: Able to place feet together independently and stand 1 minute safely From Standing, Reach Forward with Outstretched Arm: Can reach confidently >25 cm (10") From Standing Position, Pick up Object from Floor: Able to pick up shoe safely and easily From Standing Position, Turn to Look Behind Over each Shoulder: Looks behind from both sides and weight shifts well Turn 360 Degrees: Able to turn 360 degrees safely but slowly Standing Unsupported, Alternately Place Feet on Step/Stool: Able to complete >2 steps/needs minimal assist Standing Unsupported, One Foot in Front: Able to plae foot ahead of the other independently and hold 30 seconds Standing on One Leg: Able to lift leg independently and hold 5-10 seconds Total Score: 49         Pertinent Vitals/Pain Pain Assessment: No/denies pain    Home Living Family/patient expects to be discharged to:: Private residence Living Arrangements: Spouse/significant other Available Help at Discharge: Family Type of Home: House Home Access: Stairs to enter;Ramped entrance(son had ALS) Entrance Stairs-Rails: Can reach both Entrance Stairs-Number of Steps: 4 Home Layout: One level Home Equipment: Walker - 4 wheels      Prior Function Level of Independence: Independent         Comments: Pt independent in all ADL, IADL, and mobility tasks. Pt does not ambulate with an assistive device and reports 0 falls in the last 6 months. Pt still drives.      Hand Dominance   Dominant Hand: Right    Extremity/Trunk Assessment   Upper Extremity Assessment Upper Extremity Assessment: Defer to OT evaluation    Lower Extremity Assessment Lower Extremity Assessment: Overall WFL for tasks assessed    Cervical / Trunk Assessment Cervical / Trunk Assessment: Normal  Communication   Communication: No difficulties  Cognition Arousal/Alertness: Awake/alert Behavior During Therapy: WFL for tasks assessed/performed Overall Cognitive Status: Within  Functional Limits for tasks assessed                                 General Comments: Pt pleasant and willing to participate in therapy tasks. Pt able to follow multi-step instructions without difficulty.       General Comments General comments (skin integrity, edema, etc.): Pt feels his walking is ~75-80% back to normal    Exercises     Assessment/Plan    PT Assessment Patient needs continued PT services  PT Problem List Decreased balance;Decreased mobility       PT Treatment Interventions Gait training;Stair training;Functional mobility training;Therapeutic activities;Balance training;Neuromuscular re-education;Patient/family education    PT Goals (Current goals can be found in the Care Plan section)  Acute Rehab PT Goals Patient Stated Goal: to go home PT Goal Formulation: With patient Time For Goal Achievement: 05/15/19 Potential to Achieve Goals: Good    Frequency Min 4X/week   Barriers to discharge        Co-evaluation               AM-PAC PT "6 Clicks" Mobility  Outcome Measure Help needed turning from your back to your side while in a flat bed without using bedrails?: None Help needed moving from lying on your back to sitting on the side of a flat bed without using bedrails?: None Help needed moving to and from a bed to a chair (including a wheelchair)?: A Little Help needed standing up from a chair using your arms (e.g., wheelchair or bedside  chair)?: A Little Help needed to walk in hospital room?: A Little Help needed climbing 3-5 steps with a railing? : A Little 6 Click Score: 20    End of Session Equipment Utilized During Treatment: Gait belt Activity Tolerance: Patient tolerated treatment well Patient left: in bed;with call bell/phone within reach;with bed alarm set(pt preferred upright in bed instead of recliner) Nurse Communication: Mobility status PT Visit Diagnosis: Unsteadiness on feet (R26.81);Other abnormalities of gait and  mobility (R26.89)    Time: [id number] PT Time Calculation (min) (ACUTE ONLY): 18 min   Charges:   PT Evaluation $PT Eval Moderate Complexity: 1 Mod           Arby Barrette, PT Pager [phone]   Rexanne Mano 05/08/2019, 5:58 PM

## 2019-05-08 NOTE — Progress Notes (Signed)
STROKE TEAM PROGRESS NOTE   INTERVAL HISTORY No family is at the bedside.  Patient lying in bed, stated that his symptoms not improved.  He denies any heart palpitations, diplopia or dizziness at this time.  He agrees with loop recorder placement tomorrow.  OBJECTIVE Vitals:   05/08/19 0302 05/08/19 0435 05/08/19 0753 05/08/19 0840  BP: 132/78 (!) 141/96  (!) 151/89  Pulse: 70 73  67  Resp:  18  18  Temp:  97.8 F (36.6 C) 98.3 F (36.8 C) (!) 97.5 F (36.4 C)  TempSrc:  Oral Oral   SpO2: 100% 98%  100%  Weight: 74.5 kg 74.5 kg    Height:  5\' 9"  (1.753 m)      CBC:  Recent Labs  Lab 05/07/19 1449 05/07/19 1451  WBC 11.5*  --   NEUTROABS 5.4  --   HGB 15.4 15.6  HCT 46.6 46.0  MCV 91.2  --   PLT 302  --     Basic Metabolic Panel:  Recent Labs  Lab 05/07/19 1449 05/07/19 1449 05/07/19 1451 05/08/19 0334  NA 137   < > 138 138  K 3.7   < > 3.6 4.1  CL 103   < > 105 105  CO2 19*  --   --  23  GLUCOSE 122*   < > 116* 120*  BUN 20   < > 21 19  CREATININE 1.19   < > 1.10 1.12  CALCIUM 9.1  --   --  9.0   < > = values in this interval not displayed.    Lipid Panel:     Component Value Date/Time   CHOL 144 05/08/2019 0334   TRIG 117 05/08/2019 0334   HDL 59 05/08/2019 0334   CHOLHDL 2.4 05/08/2019 0334   VLDL 23 05/08/2019 0334   LDLCALC 62 05/08/2019 0334   LDLCALC 53 02/09/2019 0808   HgbA1c:  Lab Results  Component Value Date   HGBA1C 5.9 (H) 05/08/2019   Urine Drug Screen: No results found for: LABOPIA, COCAINSCRNUR, LABBENZ, AMPHETMU, THCU, LABBARB  Alcohol Level No results found for: [place] Code Stroke CTA Head W/WO contrast CT Code Stroke CTA Neck W/WO contrast 05/07/2019 IMPRESSION:  1. Occlusion of the distal right vertebral artery at the C1 level through the V4 level. This has ill-defined margins and likely is acute either due dissection or less likely atherosclerotic disease. No significant atherosclerotic calcification is seen in  the area.  2. Mild atherosclerotic disease carotid bifurcation bilaterally without stenosis. Left vertebral artery patent to the basilar without stenosis.  3. No evidence of right M2 large vessel occlusion.   MR BRAIN WO CONTRAST 05/07/2019 IMPRESSION: Acute infarct in the right cerebellar tonsil, right cerebellum, and left pons. Small acute infarct in the left parietal lobe. Occlusion distal right vertebral artery which appears acute on CTA.   CT HEAD CODE STROKE WO CONTRAST 05/07/2019 IMPRESSION:  1. No acute abnormality. Atrophy and chronic microvascular ischemia.  2. ASPECTS is 10  3. Possible hyperdense right M2 segment in the sylvian fissure. Correlate with CTA findings.   Transthoracic Echocardiogram  1. Hyperdynamic LV systolic function; mild LVH; grade 1 diastolic  dysfunction.  2. Left ventricular ejection fraction, by estimation, is 65 to 70%. The  left ventricle has normal function. The left ventricle has no regional  wall motion abnormalities. There is mild left ventricular hypertrophy.  Left ventricular diastolic parameters  are consistent with Grade I diastolic dysfunction (impaired  relaxation).  3. Right ventricular systolic function is normal. The right ventricular  size is normal.  4. The mitral valve is normal in structure. Trivial mitral valve  regurgitation. No evidence of mitral stenosis.  5. The aortic valve is tricuspid. Aortic valve regurgitation is not  visualized. No aortic stenosis is present.  6. The inferior vena cava is normal in size with greater than 50%  respiratory variability, suggesting right atrial pressure of 3 mmHg.    PHYSICAL EXAM  Temp:  [97.5 F (36.4 C)-98.3 F (36.8 C)] 97.8 F (36.6 C) (05/02 1244) Pulse Rate:  [62-82] 64 (05/02 1244) Resp:  [14-22] 18 (05/02 1244) BP: (120-170)/(67-99) 165/96 (05/02 1244) SpO2:  [93 %-100 %] 99 % (05/02 1244) Weight:  [74.5 kg] 74.5 kg (05/02 0435)  General - Well nourished, well  developed, in no apparent distress.  Ophthalmologic - fundi not visualized due to noncooperation.  Cardiovascular - Regular rhythm and rate.  Mental Status -  Level of arousal and orientation to time, place, and person were intact. Language including expression, naming, repetition, comprehension was assessed and found intact. Fund of Knowledge was assessed and was intact.  Cranial Nerves II - XII - II - Visual field intact OU. III, IV, VI - Extraocular movements intact. V - Facial sensation intact bilaterally. VII - Facial movement intact bilaterally. VIII - Hearing & vestibular intact bilaterally. X - Palate elevates symmetrically. XI - Chin turning & shoulder shrug intact bilaterally. XII - Tongue protrusion intact.  Motor Strength - The patient's strength was normal in all extremities and pronator drift was absent.  Bulk was normal and fasciculations were absent.   Motor Tone - Muscle tone was assessed at the neck and appendages and was normal.  Reflexes - The patient's reflexes were symmetrical in all extremities and he had no pathological reflexes.  Sensory - Light touch, temperature/pinprick were assessed and were symmetrical.    Coordination - The patient had normal movements in the hands with no ataxia or dysmetria. Slight right LE HTS dysmetria. Tremor was absent.  Gait and Station - deferred.   ASSESSMENT/PLAN Christian [name] is a 74 y.o. male with history of [facility] HLD, ASPVD, CKD 3, tobacco abuse who presented to [facility] ED as a code stroke for dizziness, dysarthria, diplopia and left facial droop.  He did not receive IV t-PA as his deficits were mild.  Stroke:  Multiple bilateral strokes - embolic pattern - concerning for cardioembolic source  CT Head - No acute abnormality. Atrophy and chronic microvascular ischemia. ASPECTS is 10  CTA H&N - Occlusion of the distal right vertebral artery at the C1 level through the V4 level. This has ill-defined margins and likely is  acute either due dissection or less likely atherosclerotic disease.   MRI head - Acute infarct in the right cerebellar tonsil, right cerebellum, and left pons. Small acute infarct in the left parietal lobe. Occlusion distal right vertebral artery which appears acute on CTA.   2D Echo EF 65-70%  LE venous Doppler pending  Consider loop recorder placement if stroke work-up unrevealing.  Sars Corona Virus 2 - negative  LDL - 62  HgbA1c - 5.9  VTE prophylaxis - [place] Heparin / SCDs  aspirin 81 mg daily prior to admission, now on aspirin 325 mg daily and clopidogrel 75 mg daily.  Continue DAPT for 3 months given right VA occlusion and then Plavix alone.  Patient counseled to be compliant with his antithrombotic medications  Ongoing aggressive stroke risk factor  management  Therapy recommendations: Home health OT  Disposition:  Pending  Hypertension  Home BP meds:  none  Current BP meds: none  Stable . Permissive hypertension (OK if < 220/120) but gradually normalize in 2-3 days  . Long-term BP goal normotensive  Hyperlipidemia  Home Lipid lowering medication: Lipitor 40 mg daily  LDL 62, goal < 70  Current lipid lowering medication: Lipitor 40 mg daily  Continue statin at discharge  Tobacco abuse  Current smoker  Smoking cessation counseling provided  Pt is willing to quit  Other Stroke Risk Factors  Advanced age  ETOH use, advised to drink no more than 1 alcoholic beverage per day.  Family hx stroke (mother)  Other Active Problems  Code status - Full code  Mild hyperglycemia  Hospital day # 0  Rosalin Hawking, MD PhD Stroke Neurology 05/08/2019 4:28 PM   To contact Stroke Continuity provider, please refer to [email or web address] After hours, contact General Neurology

## 2019-05-08 NOTE — Progress Notes (Signed)
  Echocardiogram 2D Echocardiogram has been performed.  Christian [name] [name] 05/08/2019, 2:47 PM

## 2019-05-08 NOTE — Progress Notes (Signed)
PROGRESS NOTE  Christian [name] [id number]:[id number] DOB: 03-Aug-1945 DOA: 05/07/2019 PCP: Susy Frizzle, MD  Brief history patient was out shopping when he became diaphoretic, dizzy, and nauseous. Patien also noted that he was having left sided facial droop and weakness with ems. Patient was also having some dysphagia Code stroke    HPI/Recap of past 24 hours:  Feeling much better, symptom has almost resolved  Assessment/Plan: Active Problems:   Hyperlipidemia   Smoker   CKD (chronic kidney disease) stage 3, GFR 30-59 ml/min   CVA (cerebral vascular accident) ([place])   Stroke (cerebrum) ([place])  Acute bilateral stroke: MRI: Acute infarct in the right cerebellar tonsil, right cerebellum, and left pons. Small acute infarct in the left parietal lobe. Occlusion distal right vertebral artery which appears acute on CTA. -He did not receive TPA due to mild symptom -A1c 5.9, LDL 62 -Echocardiogram pending,  -PT eval and speech pending -Neurology consulted, currently on aspirin, Plavix, statin, plan for loop recorder placement on Monday, keep n.p.o. after midnight(cardiology made aware)  Hypertension -He is not on any blood pressure medication at home -Allow permissive hypertension for now  CKD 2, stable  Tobacco use, smoking cessation education provided     DVT Prophylaxis: Subcu heparin  Code Status: Full  Family Communication: patient   Disposition Plan:    Patient came from:                          Home                                                                                 Anticipated d/c place:  Home  Barriers to d/c OR conditions which need to be met to effect a safe d/c:  Needs echocardiogram, need PT eval and speech eval, needs loop recorder placement, then can discharge home with home health   Consultants:  Neurology  Cardiology  Procedures:  None  Antibiotics:  None   Objective: BP (!) 165/96 (BP Location: Right Arm)   Pulse 64   Temp  97.8 F (36.6 C) (Oral)   Resp 18   Ht 5' 9"  (1.753 m)   Wt 74.5 kg   SpO2 99%   BMI 24.25 kg/m   Intake/Output Summary (Last 24 hours) at 05/08/2019 1458 Last data filed at 05/08/2019 0908 Gross per 24 hour  Intake 580.51 ml  Output 300 ml  Net 280.51 ml   Filed Weights   05/07/19 1520 05/08/19 0302 05/08/19 0435  Weight: 77.3 kg 74.5 kg 74.5 kg    Exam: Patient is examined daily including today on 05/08/2019, exams remain the same as of yesterday except that has changed    General:  NAD  Cardiovascular: RRR  Respiratory: CTABL  Abdomen: Soft/ND/NT, positive BS  Musculoskeletal: No Edema  Neuro: alert, oriented   Data Reviewed: Basic Metabolic Panel: Recent Labs  Lab 05/07/19 1449 05/07/19 1451 05/08/19 0334  NA 137 138 138  K 3.7 3.6 4.1  CL 103 105 105  CO2 19*  --  23  GLUCOSE 122* 116* 120*  BUN 20 21 19   CREATININE 1.19 1.10 1.12  CALCIUM 9.1  --  9.0   Liver Function Tests: Recent Labs  Lab 05/07/19 1449  AST 29  ALT 39  ALKPHOS 120  BILITOT 0.9  PROT 6.5  ALBUMIN 3.9   No results for input(s): LIPASE, AMYLASE in the last 168 hours. No results for input(s): AMMONIA in the last 168 hours. CBC: Recent Labs  Lab 05/07/19 1449 05/07/19 1451  WBC 11.5*  --   NEUTROABS 5.4  --   HGB 15.4 15.6  HCT 46.6 46.0  MCV 91.2  --   PLT 302  --    Cardiac Enzymes:   No results for input(s): CKTOTAL, CKMB, CKMBINDEX, TROPONINI in the last 168 hours. BNP (last 3 results) No results for input(s): BNP in the last 8760 hours.  ProBNP (last 3 results) No results for input(s): PROBNP in the last 8760 hours.  CBG: Recent Labs  Lab 05/07/19 1446  GLUCAP 113*    Recent Results (from the past 240 hour(s))  Respiratory Panel by RT PCR (Flu A&B, Covid) - Nasopharyngeal Swab     Status: None   Collection Time: 05/07/19  4:57 PM   Specimen: Nasopharyngeal Swab  Result Value Ref Range Status   SARS Coronavirus 2 by RT PCR NEGATIVE NEGATIVE Final     Comment: (NOTE) SARS-CoV-2 target nucleic acids are NOT DETECTED. The SARS-CoV-2 RNA is generally detectable in upper respiratoy specimens during the acute phase of infection. The lowest concentration of SARS-CoV-2 viral copies this assay can detect is 131 copies/mL. A negative result does not preclude SARS-Cov-2 infection and should not be used as the sole basis for treatment or other patient management decisions. A negative result may occur with  improper specimen collection/handling, submission of specimen other than nasopharyngeal swab, presence of viral mutation(s) within the areas targeted by this assay, and inadequate number of viral copies (<131 copies/mL). A negative result must be combined with clinical observations, patient history, and epidemiological information. The expected result is Negative. Fact Sheet for Patients:  PinkCheek.be Fact Sheet for Healthcare Providers:  GravelBags.it This test is not yet ap proved or cleared by the [place] FDA and  has been authorized for detection and/or diagnosis of SARS-CoV-2 by FDA under an Emergency Use Authorization (EUA). This EUA will remain  in effect (meaning this test can be used) for the duration of the COVID-19 declaration under Section 564(b)(1) of the Act, 21 U.S.C. section 360bbb-3(b)(1), unless the authorization is terminated or revoked sooner.    Influenza A by PCR NEGATIVE NEGATIVE Final   Influenza B by PCR NEGATIVE NEGATIVE Final    Comment: (NOTE) The Xpert Xpress SARS-CoV-2/FLU/RSV assay is intended as an aid in  the diagnosis of influenza from Nasopharyngeal swab specimens and  should not be used as a sole basis for treatment. Nasal washings and  aspirates are unacceptable for Xpert Xpress SARS-CoV-2/FLU/RSV  testing. Fact Sheet for Patients: PinkCheek.be Fact Sheet for Healthcare  Providers: GravelBags.it This test is not yet approved or cleared by the [place] FDA and  has been authorized for detection and/or diagnosis of SARS-CoV-2 by  FDA under an Emergency Use Authorization (EUA). This EUA will remain  in effect (meaning this test can be used) for the duration of the  Covid-19 declaration under Section 564(b)(1) of the Act, 21  U.S.C. section 360bbb-3(b)(1), unless the authorization is  terminated or revoked. Performed at [facility], [place] [address], [place], [place] [address]      Studies: CT Code Stroke CTA Head W/WO contrast  Result Date:  05/07/2019 CLINICAL DATA:  Acute neuro deficit. Dysarthria, dizziness, facial droop, nausea and vomiting EXAM: CT ANGIOGRAPHY HEAD AND NECK TECHNIQUE: Multidetector CT imaging of the head and neck was performed using the standard protocol during bolus administration of intravenous contrast. Multiplanar CT image reconstructions and MIPs were obtained to evaluate the vascular anatomy. Carotid stenosis measurements (when applicable) are obtained utilizing NASCET criteria, using the distal internal carotid diameter as the denominator. CONTRAST:  12m OMNIPAQUE IOHEXOL 350 MG/ML SOLN COMPARISON:  CT head 05/07/2019 FINDINGS: CTA NECK FINDINGS Aortic arch: Standard branching. Proximal great vessels widely patent. Mild atherosclerotic calcification aortic arch. Right carotid system: Mild atherosclerotic calcification right carotid bifurcation without significant stenosis. Left carotid system: Atherosclerotic calcification left carotid bulb without significant stenosis. Vertebral arteries: Left vertebral dominant. Left vertebral artery widely patent to the basilar without stenosis. Right vertebral artery is patent to the C1 level where there is tapered occlusion with ill-defined margins suggesting acute thrombus. No atherosclerotic disease at this level. Skeleton: Cervical spondylosis.  No acute skeletal  abnormality. Other neck: Negative Upper chest: Lung apices clear bilaterally. Review of the MIP images confirms the above findings CTA HEAD FINDINGS Anterior circulation: Cavernous carotid widely patent bilaterally without stenosis. Anterior and middle cerebral arteries widely patent bilaterally. Hyperdense right M1 segment on the CT does not correlate with large vessel occlusion. Posterior circulation: Occlusion of the distal right vertebral artery at the C1 level. This is likely acute with ill-defined margins. There is reconstitution of the very distal portion of the right vertebral artery. Left vertebral artery is patent to the basilar. PICA patent bilaterally. Basilar widely patent. Superior cerebellar and posterior cerebral arteries patent bilaterally without stenosis. Venous sinuses: Normal venous enhancement. Anatomic variants: None Review of the MIP images confirms the above findings IMPRESSION: 1. Occlusion of the distal right vertebral artery at the C1 level through the V4 level. This has ill-defined margins and likely is acute either due dissection or less likely atherosclerotic disease. No significant atherosclerotic calcification is seen in the area. 2. Mild atherosclerotic disease carotid bifurcation bilaterally without stenosis. Left vertebral artery patent to the basilar without stenosis. 3. No evidence of right M2 large vessel occlusion. Electronically Signed   By: CFranchot GalloM.D.   On: 05/07/2019 15:29   CT Code Stroke CTA Neck W/WO contrast  Result Date: 05/07/2019 CLINICAL DATA:  Acute neuro deficit. Dysarthria, dizziness, facial droop, nausea and vomiting EXAM: CT ANGIOGRAPHY HEAD AND NECK TECHNIQUE: Multidetector CT imaging of the head and neck was performed using the standard protocol during bolus administration of intravenous contrast. Multiplanar CT image reconstructions and MIPs were obtained to evaluate the vascular anatomy. Carotid stenosis measurements (when applicable) are  obtained utilizing NASCET criteria, using the distal internal carotid diameter as the denominator. CONTRAST:  565mOMNIPAQUE IOHEXOL 350 MG/ML SOLN COMPARISON:  CT head 05/07/2019 FINDINGS: CTA NECK FINDINGS Aortic arch: Standard branching. Proximal great vessels widely patent. Mild atherosclerotic calcification aortic arch. Right carotid system: Mild atherosclerotic calcification right carotid bifurcation without significant stenosis. Left carotid system: Atherosclerotic calcification left carotid bulb without significant stenosis. Vertebral arteries: Left vertebral dominant. Left vertebral artery widely patent to the basilar without stenosis. Right vertebral artery is patent to the C1 level where there is tapered occlusion with ill-defined margins suggesting acute thrombus. No atherosclerotic disease at this level. Skeleton: Cervical spondylosis.  No acute skeletal abnormality. Other neck: Negative Upper chest: Lung apices clear bilaterally. Review of the MIP images confirms the above findings CTA HEAD FINDINGS Anterior circulation: Cavernous carotid  widely patent bilaterally without stenosis. Anterior and middle cerebral arteries widely patent bilaterally. Hyperdense right M1 segment on the CT does not correlate with large vessel occlusion. Posterior circulation: Occlusion of the distal right vertebral artery at the C1 level. This is likely acute with ill-defined margins. There is reconstitution of the very distal portion of the right vertebral artery. Left vertebral artery is patent to the basilar. PICA patent bilaterally. Basilar widely patent. Superior cerebellar and posterior cerebral arteries patent bilaterally without stenosis. Venous sinuses: Normal venous enhancement. Anatomic variants: None Review of the MIP images confirms the above findings IMPRESSION: 1. Occlusion of the distal right vertebral artery at the C1 level through the V4 level. This has ill-defined margins and likely is acute either due  dissection or less likely atherosclerotic disease. No significant atherosclerotic calcification is seen in the area. 2. Mild atherosclerotic disease carotid bifurcation bilaterally without stenosis. Left vertebral artery patent to the basilar without stenosis. 3. No evidence of right M2 large vessel occlusion. Electronically Signed   By: Franchot Gallo M.D.   On: 05/07/2019 15:29   MR BRAIN WO CONTRAST  Result Date: 05/07/2019 CLINICAL DATA:  Stroke. EXAM: MRI HEAD WITHOUT CONTRAST TECHNIQUE: Multiplanar, multiecho pulse sequences of the brain and surrounding structures were obtained without intravenous contrast. COMPARISON:  CT angio head neck today FINDINGS: Brain: Acute infarct in the right cerebellar tonsil, right posterior cerebellum, and left pons. In addition, there is a small acute infarct in the left parietal lobe. Negative for hemorrhage or mass. Ventricle size normal. Scattered small white matter hyperintensities compatible with mild chronic microvascular ischemia. Chronic ischemic change in the pons left greater than right. Vascular: Occlusion distal right vertebral artery with hyperintense thrombus as noted on CTA. Distal left vertebral artery and basilar artery and both internal carotid artery showed normal arterial flow voids. Skull and upper cervical spine: No focal skeletal lesion. Sinuses/Orbits: Mucosal edema paranasal sinuses.  Negative orbit Other: None IMPRESSION: Acute infarct in the right cerebellar tonsil, right cerebellum, and left pons. Small acute infarct in the left parietal lobe. Occlusion distal right vertebral artery which appears acute on CTA. Electronically Signed   By: Franchot Gallo M.D.   On: 05/07/2019 16:45    Scheduled Meds: . vitamin C  500 mg Oral Daily  . aspirin EC  81 mg Oral Daily  . atorvastatin  40 mg Oral Daily  . cholecalciferol  1,000 Units Oral Daily  . clopidogrel  75 mg Oral Daily  . fluticasone  2 spray Each Nare Daily  . heparin  5,000 Units  Subcutaneous Q8H  . loratadine  10 mg Oral Daily  . multivitamin with minerals  1 tablet Oral Daily  . sodium chloride flush  3 mL Intravenous Once    Continuous Infusions:   Time spent: 52mns I have personally reviewed and interpreted on  05/08/2019 daily labs, tele strips, imagings as discussed above under date review session and assessment and plans.  I reviewed all nursing notes, pharmacy notes, consultant notes,  vitals, pertinent old records  I have discussed plan of care as described above with RN , patient  on 05/08/2019   FFlorencia ReasonsMD, PhD, FACP  [facility]  Available via Epic secure chat 7am-7pm for nonurgent issues Please page for urgent issues, pager number available through [employer] .   05/08/2019, 2:58 PM  LOS: 0 days

## 2019-05-09 ENCOUNTER — Inpatient Hospital Stay (HOSPITAL_COMMUNITY): Payer: Medicare Other

## 2019-05-09 ENCOUNTER — Encounter (HOSPITAL_COMMUNITY)
Admission: EM | Disposition: A | Discharge status: Home / Self Care | Payer: Self-pay | Source: Home / Self Care | Attending: Internal Medicine

## 2019-05-09 DIAGNOSIS — E7800 Pure hypercholesterolemia, unspecified: Secondary | ICD-10-CM

## 2019-05-09 DIAGNOSIS — F17200 Nicotine dependence, unspecified, uncomplicated: Secondary | ICD-10-CM

## 2019-05-09 DIAGNOSIS — I639 Cerebral infarction, unspecified: Secondary | ICD-10-CM

## 2019-05-09 DIAGNOSIS — R29898 Other symptoms and signs involving the musculoskeletal system: Secondary | ICD-10-CM

## 2019-05-09 DIAGNOSIS — I6389 Other cerebral infarction: Secondary | ICD-10-CM

## 2019-05-09 DIAGNOSIS — N1831 Chronic kidney disease, stage 3a: Secondary | ICD-10-CM

## 2019-05-09 HISTORY — PX: LOOP RECORDER INSERTION: EP1214

## 2019-05-09 LAB — CBC WITH DIFFERENTIAL/PLATELET
Abs Immature Granulocytes: 0.02 10*3/uL (ref 0.00–0.07)
Basophils Absolute: 0.1 10*3/uL (ref 0.0–0.1)
Basophils Relative: 1 %
Eosinophils Absolute: 0.3 10*3/uL (ref 0.0–0.5)
Eosinophils Relative: 3 %
HCT: 42.4 % (ref 39.0–52.0)
Hemoglobin: 14.2 g/dL (ref 13.0–17.0)
Immature Granulocytes: 0 %
Lymphocytes Relative: 43 %
Lymphs Abs: 4.1 10*3/uL — ABNORMAL HIGH (ref 0.7–4.0)
MCH: 30 pg (ref 26.0–34.0)
MCHC: 33.5 g/dL (ref 30.0–36.0)
MCV: 89.6 fL (ref 80.0–100.0)
Monocytes Absolute: 0.8 10*3/uL (ref 0.1–1.0)
Monocytes Relative: 8 %
Neutro Abs: 4.2 10*3/uL (ref 1.7–7.7)
Neutrophils Relative %: 45 %
Platelets: 255 10*3/uL (ref 150–400)
RBC: 4.73 MIL/uL (ref 4.22–5.81)
RDW: 13.5 % (ref 11.5–15.5)
WBC: 9.5 10*3/uL (ref 4.0–10.5)
nRBC: 0 % (ref 0.0–0.2)

## 2019-05-09 LAB — BASIC METABOLIC PANEL
Anion gap: 4 — ABNORMAL LOW (ref 5–15)
BUN: 17 mg/dL (ref 8–23)
CO2: 30 mmol/L (ref 22–32)
Calcium: 8.9 mg/dL (ref 8.9–10.3)
Chloride: 107 mmol/L (ref 98–111)
Creatinine, Ser: 1.3 mg/dL — ABNORMAL HIGH (ref 0.61–1.24)
GFR calc Af Amer: >60 mL/min (ref >60)
GFR calc non Af Amer: 54 mL/min — ABNORMAL LOW (ref >60)
Glucose, Bld: 107 mg/dL — ABNORMAL HIGH (ref 70–99)
Potassium: 4.2 mmol/L (ref 3.5–5.1)
Sodium: 141 mmol/L (ref 135–145)

## 2019-05-09 LAB — MAGNESIUM: Magnesium: 1.7 mg/dL (ref 1.7–2.4)

## 2019-05-09 LAB — TSH: TSH: 2.699 u[IU]/mL (ref 0.350–4.500)

## 2019-05-09 SURGERY — LOOP RECORDER INSERTION

## 2019-05-09 MED ORDER — ASPIRIN 325 MG PO TBEC: 325.0000 mg | DELAYED_RELEASE_TABLET | Freq: Every day | ORAL | 0 refills | Status: DC

## 2019-05-09 MED ORDER — LIDOCAINE-EPINEPHRINE 1 %-1:100000 IJ SOLN
INTRAMUSCULAR | Status: AC
  Filled 2019-05-09: qty 1

## 2019-05-09 MED ORDER — ONDANSETRON HCL 4 MG/2ML IJ SOLN: 4.0000 mg | Freq: Four times a day (QID) | INTRAMUSCULAR | Status: DC | PRN

## 2019-05-09 MED ORDER — CLOPIDOGREL BISULFATE 75 MG PO TABS: 75.0000 mg | ORAL_TABLET | Freq: Every day | ORAL | 1 refills | Status: DC

## 2019-05-09 MED ORDER — LIDOCAINE HCL (PF) 1 % IJ SOLN
INTRAMUSCULAR | Status: DC | PRN
  Administered 2019-05-09: 10 mL

## 2019-05-09 MED ORDER — ACETAMINOPHEN 325 MG PO TABS: 325.0000 mg | ORAL_TABLET | ORAL | Status: DC | PRN

## 2019-05-09 MED ORDER — ACETAMINOPHEN 325 MG PO TABS: 650.0000 mg | ORAL_TABLET | ORAL | Status: AC | PRN

## 2019-05-09 SURGICAL SUPPLY — 2 items
MONITOR REVEAL LINQ II (Prosthesis & Implant Heart) ×1 IMPLANT
PACK LOOP INSERTION (CUSTOM PROCEDURE TRAY) ×2 IMPLANT

## 2019-05-09 NOTE — Progress Notes (Signed)
Occupational Therapy Treatment Patient Details Name: Christian [name] MRN: [id number] DOB: May 31, 1945 Today's Date: 05/09/2019    History of present illness  74 y.o. male with medical history significant of hyperlipidemia, PVD, tobacco abuse, CKD stage 3 who presented to the hospital with sudden onset dizziness and nausea this afternoon. He had noted left facial droop as well as slurred speech. MRI was then performed revealing a small acute infarct in the right cerebellar tonsil, right cerebellum, and the left pons and a small infarct in the left parietal lobe.    OT comments  Pt making good progress towards OT goals this session. Session focus on functional mobility and dynamic balance tasks related to IADLs. Overall, pt requires min guard- supervision for functional mobility with no AD. Pt continues to present with L lateral drift during mobility but is self aware and able to correct. Pt able to gather ADL items around room from various surface heights without LOB and completed IADL task of making bed with no difficulties. DC plan remains appropriate, will follow acutely per POC.     Follow Up Recommendations  Home health OT;Supervision/Assistance - 24 hour    Equipment Recommendations  3 in 1 bedside commode;Other (comment)(for use in shower)    Recommendations for Other Services      Precautions / Restrictions Precautions Precautions: Fall Restrictions Weight Bearing Restrictions: No       Mobility Bed Mobility Overal bed mobility: Needs Assistance Bed Mobility: Supine to Sit     Supine to sit: Modified independent (Device/Increase time)     General bed mobility comments: no physcial assist needed with use of bed rails  Transfers Overall transfer level: Needs assistance Equipment used: None Transfers: Sit to/from Stand Sit to Stand: Supervision         General transfer comment: sup for safety    Balance Overall balance assessment: Needs assistance Sitting-balance  support: Feet supported Sitting balance-Leahy Scale: Good     Standing balance support: No upper extremity supported Standing balance-Leahy Scale: Good Standing balance comment: able to reach out of bos to retrieve items on floow                           ADL either performed or assessed with clinical judgement   ADL Overall ADL's : Needs assistance/impaired;Modified independent                         Toilet Transfer: Min guard;Ambulation Toilet Transfer Details (indicate cue type and reason): simulated via functional mobility with no AD and min guard for safety         Functional mobility during ADLs: Min guard General ADL Comments: pt with continued L lateral lean during mobility but self aware of deficit and able to self correct. pt abel to complete dynamic balance challenges related to IADLs such as retrieving wash cloths from around room and making up bed     Vision Baseline Vision/History: Wears glasses Wears Glasses: At all times Patient Visual Report: No change from baseline     Perception     Praxis      Cognition Arousal/Alertness: Awake/alert Behavior During Therapy: WFL for tasks assessed/performed Overall Cognitive Status: Within Functional Limits for tasks assessed                                 General Comments: pt very pleasant and  agreeable to therapy. follows mutlistep commands with no difficulty        Exercises     Shoulder Instructions       General Comments pts wife present during session supportive and helpful    Pertinent Vitals/ Pain       Pain Assessment: No/denies pain  Home Living                                          Prior Functioning/Environment              Frequency  Min 2X/week        Progress Toward Goals  OT Goals(current goals can now be found in the care plan section)  Progress towards OT goals: Progressing toward goals  Acute Rehab OT  Goals Patient Stated Goal: to go home Time For Goal Achievement: 05/22/19 Potential to Achieve Goals: Good  Plan Discharge plan remains appropriate    Co-evaluation                 AM-PAC OT "6 Clicks" Daily Activity     Outcome Measure   Help from another person eating meals?: None Help from another person taking care of personal grooming?: None Help from another person toileting, which includes using toliet, bedpan, or urinal?: A Little Help from another person bathing (including washing, rinsing, drying)?: A Little Help from another person to put on and taking off regular upper body clothing?: None Help from another person to put on and taking off regular lower body clothing?: A Little 6 Click Score: 21    End of Session    OT Visit Diagnosis: Unsteadiness on feet (R26.81)   Activity Tolerance Patient tolerated treatment well   Patient Left in bed;with call bell/phone within reach;with bed alarm set;with family/visitor present   Nurse Communication          Time: [id number] OT Time Calculation (min): 12 min  Charges: OT General Charges $OT Visit: 1 Visit OT Treatments $Therapeutic Activity: 8-22 mins  Lanier Clam., COTA/L Acute Rehabilitation Services [phone]    Ihor Gully 05/09/2019, 12:28 PM

## 2019-05-09 NOTE — Discharge Summary (Signed)
DISCHARGE SUMMARY  Christian [name]  MR#: [id number]  DOB:1945/01/23  Date of Admission: 05/07/2019 Date of Discharge: 05/09/2019  Attending Physician:Mavrik Bynum Hennie Duos, MD  Patient's DM:[id number], Cammie Mcgee, MD  Consults: Stroke Team  [facility]   Disposition: D/C home    Follow-up Appts: Follow-up Information    Susy Frizzle, MD Follow up in 1 week(s).   Specialty: Family Medicine Contact information: [address] [place] [address] [phone]        [facility] Follow up in 4 week(s).   Why: stroke clinic. office will call with appt date and time Contact information: [place] [phone] [place] Follow up.   Specialty: Rehabilitation Why: The outpatient therapy will contact you for the first appointment Contact information: [place] [phone]       [place] [facility] Follow up.   Specialty: Cardiology Why: 05/19/2019 @ 9:30AM, wound check visit (heart monitor) Contact information: [address] [place] [address] [phone]          Tests Needing Follow-up: -assess BP control  -recheck renal function -encourage tobacco cessation  -assure tolerance of ASA + Plavix  Discharge Diagnoses: Multiple bilateral embolic type CVAs HTN HLD CKD stage II Tobacco abuse  Initial presentation: 74 year old with a history of HLD, PVD, CKD stage III, and tobacco abuse who suffered the acute onset of diaphoresis, dizziness, nausea, left-sided facial droop, and left-sided weakness.  EMS was summoned and the patient was taken to the ED.  Hospital Course: Multiple bilateral embolic type CVAs Pattern worrisome for cardioembolic source -CT head with no acute abnormality -CTa noted occlusion of the distal  right vertebral artery at the C1 level through the V4 level -MRI noted acute infarct right cerebellar tonsil, right cerebellum, and left pons with small acute infarct in the left parietal lobe -neurology guided evaluation -Loop recorder placed prior to d/c -was on aspirin 81 mg prior to admit -now on aspirin 325 mg plus Plavix x3 months then Plavix alone - no evidence of DVT B LE on venous duplex - pt preferred outpt PT/OT so these orders were placed   HTN Was not on meds prior to admission -practice permissive hypertension in setting of acute CVA during hospital stay - f/u BP in outpt setting w/ long term goal being normalization of BP  HLD LDL 62 -continue Lipitor 40 mg daily  CKD stage II Creatinine stable during this hospital stay  Tobacco abuse Counseled on smoking cessation  Allergies as of 05/09/2019   No Known Allergies     Medication List    TAKE these medications   acetaminophen 325 MG tablet Commonly known as: TYLENOL Take 2 tablets (650 mg total) by mouth every 4 (four) hours as needed for mild pain (or temp > 37.5 C (99.5 F)).   aspirin 325 MG EC tablet Take 1 tablet (325 mg total) by mouth daily. Start taking on: May 10, 2019 What changed:   medication strength  how much to take   atorvastatin 40 MG tablet Commonly known as: LIPITOR TAKE 1 TABLET BY MOUTH EVERY DAY   cholecalciferol 1000 units tablet Commonly known as: VITAMIN D Take 1,000 Units by mouth daily.   clopidogrel 75 MG tablet Commonly known as: PLAVIX Take 1 tablet (75 mg total) by mouth daily. Start  taking on: May 10, 2019   diazepam 5 MG tablet Commonly known as: VALIUM Take 1 tablet (5 mg total) by mouth every 12 (twelve) hours as needed for anxiety.   fluticasone 50 MCG/ACT nasal spray Commonly known as: FLONASE PLACE 2 SPRAYS INTO EACH NOSTRIL DAILY What changed: See the new instructions.   loratadine 10 MG tablet Commonly known as: CLARITIN Take 10 mg by mouth daily.     multivitamin with minerals tablet Take 1 tablet by mouth daily.   vitamin C 500 MG tablet Commonly known as: ASCORBIC ACID Take 500 mg by mouth daily.       Day of Discharge BP (!) 158/84 (BP Location: Right Arm)   Pulse 68   Temp 98.4 F (36.9 C) (Oral)   Resp 18   Ht 5\' 9"  (1.753 m)   Wt 74.5 kg   SpO2 98%   BMI 24.25 kg/m   Physical Exam: General: No acute respiratory distress Lungs: Clear to auscultation bilaterally without wheezes or crackles Cardiovascular: Regular rate and rhythm without murmur gallop or rub normal S1 and S2 Abdomen: Nontender, nondistended, soft, bowel sounds positive, no rebound, no ascites, no appreciable mass Extremities: No significant cyanosis, clubbing, or edema bilateral lower extremities  Basic Metabolic Panel: Recent Labs  Lab 05/07/19 1449 05/07/19 1451 05/08/19 0334 05/09/19 0319  NA 137 138 138 141  K 3.7 3.6 4.1 4.2  CL 103 105 105 107  CO2 19*  --  23 30  GLUCOSE 122* 116* 120* 107*  BUN 20 21 19 17   CREATININE 1.19 1.10 1.12 1.30*  CALCIUM 9.1  --  9.0 8.9  MG  --   --   --  1.7    Liver Function Tests: Recent Labs  Lab 05/07/19 1449  AST 29  ALT 39  ALKPHOS 120  BILITOT 0.9  PROT 6.5  ALBUMIN 3.9   Coags: Recent Labs  Lab 05/07/19 1449  INR 1.0   CBC: Recent Labs  Lab 05/07/19 1449 05/07/19 1451 05/09/19 0319  WBC 11.5*  --  9.5  NEUTROABS 5.4  --  4.2  HGB 15.4 15.6 14.2  HCT 46.6 46.0 42.4  MCV 91.2  --  89.6  PLT 302  --  255    CBG: Recent Labs  Lab 05/07/19 1446  GLUCAP 113*    Recent Results (from the past 240 hour(s))  Respiratory Panel by RT PCR (Flu A&B, Covid) - Nasopharyngeal Swab     Status: None   Collection Time: 05/07/19  4:57 PM   Specimen: Nasopharyngeal Swab  Result Value Ref Range Status   SARS Coronavirus 2 by RT PCR NEGATIVE NEGATIVE Final    Comment: (NOTE) SARS-CoV-2 target nucleic acids are NOT DETECTED. The SARS-CoV-2 RNA is generally detectable in upper  respiratoy specimens during the acute phase of infection. The lowest concentration of SARS-CoV-2 viral copies this assay can detect is 131 copies/mL. A negative result does not preclude SARS-Cov-2 infection and should not be used as the sole basis for treatment or other patient management decisions. A negative result may occur with  improper specimen collection/handling, submission of specimen other than nasopharyngeal swab, presence of viral mutation(s) within the areas targeted by this assay, and inadequate number of viral copies (<131 copies/mL). A negative result must be combined with clinical observations, patient history, and epidemiological information. The expected result is Negative. Fact Sheet for Patients:  PinkCheek.be Fact Sheet for Healthcare Providers:  GravelBags.it This test is not yet ap proved or cleared  by the [place] and  has been authorized for detection and/or diagnosis of SARS-CoV-2 by FDA under an Emergency Use Authorization (EUA). This EUA will remain  in effect (meaning this test can be used) for the duration of the COVID-19 declaration under Section 564(b)(1) of the Act, 21 U.S.C. section 360bbb-3(b)(1), unless the authorization is terminated or revoked sooner.    Influenza A by PCR NEGATIVE NEGATIVE Final   Influenza B by PCR NEGATIVE NEGATIVE Final    Comment: (NOTE) The Xpert Xpress SARS-CoV-2/FLU/RSV assay is intended as an aid in  the diagnosis of influenza from Nasopharyngeal swab specimens and  should not be used as a sole basis for treatment. Nasal washings and  aspirates are unacceptable for Xpert Xpress SARS-CoV-2/FLU/RSV  testing. Fact Sheet for Patients: PinkCheek.be Fact Sheet for Healthcare Providers: GravelBags.it This test is not yet approved or cleared by the [place] FDA and  has been authorized for  detection and/or diagnosis of SARS-CoV-2 by  FDA under an Emergency Use Authorization (EUA). This EUA will remain  in effect (meaning this test can be used) for the duration of the  Covid-19 declaration under Section 564(b)(1) of the Act, 21  U.S.C. section 360bbb-3(b)(1), unless the authorization is  terminated or revoked. Performed at [facility], [employer] [address] [place], [place] [address]      Time spent in discharge (includes decision making & examination of pt): 35 minutes  05/09/2019, 6:24 PM   Cherene Altes, MD [facility] Office  [phone]

## 2019-05-09 NOTE — TOC Transition Note (Signed)
Transition of Care [facility]) - CM/SW Discharge Note   Patient Details  Name: Christian [name] MRN: [id number] Date of Birth: [birth year]  [facility]) CM/SW Contact:  Pollie Friar, RN Phone Number: 05/09/2019, 10:43 AM   Clinical Narrative:    Pt to have loop placed today and then hopes to d/c home. CM met with the patient and he prefers outpatient therapy over [facility] services. CM provided choice and [facility] selected. Orders in Epic and information on the AVS. Pt states he has DME at home including 3 in1 from his son.  Pt has supervision at home from his spouse. Pt has transportation home.   Final next level of care: OP Rehab Barriers to Discharge: No Barriers Identified   Patient Goals and CMS Choice     Choice offered to / list presented to : Patient  Discharge Placement                       Discharge Plan and Services                                     Social Determinants of Health (SDOH) Interventions     Readmission Risk Interventions No flowsheet data found.

## 2019-05-09 NOTE — Progress Notes (Signed)
Lower venous duplex       has been completed. Preliminary results can be found under CV proc through chart review. Christian [name], BS, RDMS, RVT   

## 2019-05-09 NOTE — Progress Notes (Signed)
Discharge instructions reviewed with pt and wife.  Reviewed loop recorder education and also answered all questions.  IV access removed and is now getting dressed and awaiting a w/c to go home.

## 2019-05-09 NOTE — Evaluation (Signed)
Speech Language Pathology Evaluation Patient Details Name: Christian [name] MRN: [id number] DOB: June 07, 1945 Today's Date: 05/09/2019 Time: [id number] SLP Time Calculation (min) (ACUTE ONLY): 36 min  Problem List:  Patient Active Problem List   Diagnosis Date Noted  . Acute CVA (cerebrovascular accident) ([place]) 05/08/2019  . Slurred speech   . CVA (cerebral vascular accident) ([place]) 05/07/2019  . Stroke (cerebrum) ([place]) 05/07/2019  . CKD (chronic kidney disease) stage 3, GFR 30-59 ml/min   . Hypertriglyceridemia   . Smoker   . Hyperlipidemia 06/30/2012  . Colon polyp, hyperplastic 06/30/2012   Past Medical History:  Past Medical History:  Diagnosis Date  . Adenomatous colon polyp   . Allergy    seasonal  . CKD (chronic kidney disease) stage 3, GFR 30-59 ml/min   . Hyperlipidemia   . Hypertriglyceridemia   . PVD (peripheral vascular disease) ([facility])    decreased TBI bilaterally  . Smoker    Past Surgical History:  Past Surgical History:  Procedure Laterality Date  . CHOLECYSTECTOMY  1987  . EYE SURGERY     lasik   HPI:  74 y.o. male with medical history significant of hyperlipidemia, PVD, tobacco abuse, CKD stage 3 who presented to the hospital with sudden onset dizziness and nausea this afternoon. He had noted left facial droop as well as slurred speech. MRI was then performed revealing a small acute infarct in the right cerebellar tonsil, right cerebellum, and the left pons and a small infarct in the left parietal lobe.    Assessment / Plan / Recommendation Clinical Impression   Pt appears to be at his cognitive-linguistic baseline. He acknowledges the slurred speech he had on arrival, but he sounds intelligible at the conversational level to this unfamiliar listener and he denies any residual concerns. He had minimal difficulties with a divergent naming task but thought that overall he was at his baseline. No additional acute SLP needs identified; will sign off.    SLP  Assessment  SLP Recommendation/Assessment: Patient does not need any further Speech Lanaguage Pathology Services SLP Visit Diagnosis: Cognitive communication deficit (R41.841)    Follow Up Recommendations  None    Frequency and Duration           SLP Evaluation Cognition  Overall Cognitive Status: Within Functional Limits for tasks assessed Orientation Level: Oriented X4       Comprehension  Auditory Comprehension Overall Auditory Comprehension: Appears within functional limits for tasks assessed    Expression Expression Primary Mode of Expression: Verbal Verbal Expression Overall Verbal Expression: Appears within functional limits for tasks assessed   Oral / Motor  Motor Speech Overall Motor Speech: Appears within functional limits for tasks assessed   GO                     Osie Bond., M.A. [facility] Acute Rehabilitation Services Pager [phone] Office [phone]  05/09/2019, 2:32 PM

## 2019-05-09 NOTE — Progress Notes (Signed)
05/09/19 1543  PT Visit Information  Last PT Received On 05/09/19  Assistance Needed +1  History of Present Illness  74 y.o. male with medical history significant of hyperlipidemia, PVD, tobacco abuse, CKD stage 3 who presented to the hospital with sudden onset dizziness and nausea this afternoon. He had noted left facial droop as well as slurred speech. MRI was then performed revealing a small acute infarct in the right cerebellar tonsil, right cerebellum, and the left pons and a small infarct in the left parietal lobe.   Subjective Data  Patient Stated Goal to go home  Precautions  Precautions Fall  Restrictions  Weight Bearing Restrictions No  Pain Assessment  Pain Assessment No/denies pain  Cognition  Arousal/Alertness Awake/alert  Behavior During Therapy WFL for tasks assessed/performed  Overall Cognitive Status Within Functional Limits for tasks assessed  Bed Mobility  Overal bed mobility Modified Independent  Transfers  Overall transfer level Needs assistance  Equipment used None  Transfers Sit to/from Stand  Sit to Stand Supervision  General transfer comment Supervision for safety.   Ambulation/Gait  Ambulation/Gait assistance Min guard;Min assist  Gait Distance (Feet) 200 Feet  Assistive device None  Gait Pattern/deviations Step-through pattern;Decreased stride length;Drifts right/left  General Gait Details Pt with continued drift to L and aware of drift. Increased instability noted with horizontal head turns and pt reporting dizziness, requiring seated rest. Dizziness improved with seated rest.   Gait velocity slower than normal for him  Stairs Yes  Stairs assistance Min guard  Stair Management Two rails;Alternating pattern;Forwards  Number of Stairs 4  General stair comments Min guard for safety. No LOB noted.   Modified Rankin (Stroke Patients Only)  Pre-Morbid Rankin Score 0  Modified Rankin 4  Balance  Overall balance assessment Needs assistance   Sitting-balance support Feet supported  Sitting balance-Leahy Scale Good  Standing balance support No upper extremity supported  Standing balance-Leahy Scale Fair  Standing balance comment Increased instability with horizontal head turns requiring min A  PT - End of Session  Equipment Utilized During Treatment Gait belt  Activity Tolerance Patient tolerated treatment well  Patient left in bed;with call bell/phone within reach;with family/visitor present  Nurse Communication Mobility status   PT - Assessment/Plan  PT Plan Current plan remains appropriate  PT Visit Diagnosis Unsteadiness on feet (R26.81);Other abnormalities of gait and mobility (R26.89)  PT Frequency (ACUTE ONLY) Min 4X/week  Follow Up Recommendations Outpatient PT;Supervision - Intermittent  PT equipment None recommended by PT  AM-PAC PT "6 Clicks" Mobility Outcome Measure (Version 2)  Help needed turning from your back to your side while in a flat bed without using bedrails? 4  Help needed moving from lying on your back to sitting on the side of a flat bed without using bedrails? 4  Help needed moving to and from a bed to a chair (including a wheelchair)? 3  Help needed standing up from a chair using your arms (e.g., wheelchair or bedside chair)? 4  Help needed to walk in hospital room? 3  Help needed climbing 3-5 steps with a railing?  3  6 Click Score 21  Consider Recommendation of Discharge To: Home with no services  PT Goal Progression  Progress towards PT goals Progressing toward goals  Acute Rehab PT Goals  PT Goal Formulation With patient  Time For Goal Achievement 05/15/19  Potential to Achieve Goals Good  PT Time Calculation  PT Start Time (ACUTE ONLY) 1148  PT Stop Time (ACUTE ONLY) 1203  PT Time Calculation (min) (ACUTE ONLY) 15 min  PT General Charges  $$ ACUTE PT VISIT 1 Visit  PT Treatments  $Gait Training 8-22 mins   Pt progressing towards goals. Practiced gait and stair training. Pt reporting  increased dizziness and demonstrated increased instability with horizontal head turns. Required min A for steadying and seated rest. Feel current recommendations appropriate. Will continue to follow acutely to maximize functional mobility independence and safety.   Reuel Derby, PT, DPT  Acute Rehabilitation Services  Pager: [phone] Office: [phone]

## 2019-05-09 NOTE — Progress Notes (Signed)
STROKE TEAM PROGRESS NOTE   INTERVAL HISTORY Wife at bedside.  Patient lying in bed, no acute event overnight, no complaints.  Neuro stable.  Still pending for loop recorder before discharge.  OBJECTIVE Vitals:   05/09/19 0012 05/09/19 0418 05/09/19 0726 05/09/19 1212  BP: (!) 165/86 (!) 163/80 (!) 154/90 (!) 151/84  Pulse: 68 63 63 60  Resp:  18 18 18   Temp: 97.6 F (36.4 C) 98.1 F (36.7 C) 98.2 F (36.8 C) 97.8 F (36.6 C)  TempSrc: Oral Oral Oral Oral  SpO2: 98% 97% 99% 97%  Weight:      Height:        CBC:  Recent Labs  Lab 05/07/19 1449 05/07/19 1449 05/07/19 1451 05/09/19 0319  WBC 11.5*  --   --  9.5  NEUTROABS 5.4  --   --  4.2  HGB 15.4   < > 15.6 14.2  HCT 46.6   < > 46.0 42.4  MCV 91.2  --   --  89.6  PLT 302  --   --  255   < > = values in this interval not displayed.    Basic Metabolic Panel:  Recent Labs  Lab 05/08/19 0334 05/09/19 0319  NA 138 141  K 4.1 4.2  CL 105 107  CO2 23 30  GLUCOSE 120* 107*  BUN 19 17  CREATININE 1.12 1.30*  CALCIUM 9.0 8.9  MG  --  1.7   Lipid Panel:     Component Value Date/Time   CHOL 144 05/08/2019 0334   TRIG 117 05/08/2019 0334   HDL 59 05/08/2019 0334   CHOLHDL 2.4 05/08/2019 0334   VLDL 23 05/08/2019 0334   LDLCALC 62 05/08/2019 0334   LDLCALC 53 02/09/2019 0808   HgbA1c:  Lab Results  Component Value Date   HGBA1C 5.9 (H) 05/08/2019   IMAGING past 24h No results found.   PHYSICAL EXAM    Temp:  [97.6 F (36.4 C)-98.2 F (36.8 C)] 97.8 F (36.6 C) (05/03 1212) Pulse Rate:  [60-68] 60 (05/03 1212) Resp:  [18] 18 (05/03 1212) BP: (141-165)/(80-90) 151/84 (05/03 1212) SpO2:  [97 %-99 %] 97 % (05/03 1212)  General - Well nourished, well developed, in no apparent distress.  Ophthalmologic - fundi not visualized due to noncooperation.  Cardiovascular - Regular rhythm and rate.  Mental Status -  Level of arousal and orientation to time, place, and person were intact. Language  including expression, naming, repetition, comprehension was assessed and found intact. Fund of Knowledge was assessed and was intact.  Cranial Nerves II - XII - II - Visual field intact OU. III, IV, VI - Extraocular movements intact. V - Facial sensation intact bilaterally. VII - Facial movement intact bilaterally. VIII - Hearing & vestibular intact bilaterally. X - Palate elevates symmetrically. XI - Chin turning & shoulder shrug intact bilaterally. XII - Tongue protrusion intact.  Motor Strength - The patient's strength was normal in all extremities and pronator drift was absent.  Bulk was normal and fasciculations were absent.   Motor Tone - Muscle tone was assessed at the neck and appendages and was normal.  Reflexes - The patient's reflexes were symmetrical in all extremities and he had no pathological reflexes.  Sensory - Light touch, temperature/pinprick were assessed and were symmetrical.    Coordination - The patient had normal movements in the hands with no ataxia or dysmetria. Slight right LE HTS dysmetria. Tremor was absent.  Gait and Station - deferred.  ASSESSMENT/PLAN [name] is a 74 y.o. male with history of OMH HLD, ASPVD, CKD 3, tobacco abuse who presented to [facility] ED as a code stroke for dizziness, dysarthria, diplopia and left facial droop.  He did not receive IV t-PA as his deficits were mild.  Stroke:  Multiple bilateral strokes - embolic pattern - concerning for cardioembolic source  CT Head - No acute abnormality. Atrophy and chronic microvascular ischemia. ASPECTS is 10  CTA H&N - Occlusion of the distal right vertebral artery at the C1 level through the V4 level. This has ill-defined margins and likely is acute either due dissection or less likely atherosclerotic disease.   MRI head - Acute infarct in the right cerebellar tonsil, right cerebellum, and left pons. Small acute infarct in the left parietal lobe. Occlusion distal right vertebral artery  which appears acute on CTA.   2D Echo EF 65-70%  LE venous Doppler no DVT  For loop recorder placement 05/09/2019  Lacey Jensen Virus 2 - negative  LDL - 62  HgbA1c - 5.9  VTE prophylaxis - [place] Heparin / SCDs  aspirin 81 mg daily prior to admission, now on aspirin 325 mg daily and clopidogrel 75 mg daily.  Continue DAPT for 3 months given right VA occlusion and then Plavix alone.  Therapy recommendations: Home health OT, OP PT, no SLP -> OP therapy follow up arranged by TOC  Disposition:  Return home [facility] for d/c from stroke standpoint once loop placed Follow-up Stroke Clinic at [facility] in 4 weeks. Office will call with appointment date and time. Order placed.  Blood Pressure  Home BP meds:  None, no hx HTN  Current BP meds: none  Stable . Permissive hypertension (OK if < 220/120) but gradually normalize in 2-3 days  . Long-term BP goal normotensive  Hyperlipidemia  Home Lipid lowering medication: Lipitor 40 mg daily  LDL 62, goal < 70  Current lipid lowering medication: Lipitor 40 mg daily  Continue statin at discharge  Tobacco abuse  Current smoker  Smoking cessation counseling provided  Pt is willing to quit  Other Stroke Risk Factors  Advanced age  ETOH use, advised to drink no more than 1 alcoholic beverage per day.  Family hx stroke (mother)  Other Active Problems  Code status - Full code  Mild hyperglycemia  Hospital day # 1  Neurology will sign off. Please call with questions. Pt will follow up with stroke clinic NP at [facility] in about 4 weeks. Thanks for the consult.  Rosalin Hawking, MD PhD Stroke Neurology 05/09/2019 3:13 PM   To contact Stroke Continuity provider, please refer to [email or web address] After hours, contact General Neurology

## 2019-05-09 NOTE — Discharge Instructions (Signed)
Wound care instructions (heart monitor) Keep incision clean and dry for 3 days. You can remove outer dressing tomorrow. Leave steri-strips (little pieces of tape) on until seen in the office for wound check appointment. Call the office [phone]) for redness, drainage, swelling, or fever.   Ischemic Stroke  An ischemic stroke (cerebrovascular accident, or CVA) is the sudden death of brain tissue that occurs when an area of the brain does not get enough oxygen. It is a medical emergency that must be treated right away. An ischemic stroke can cause permanent loss of brain function. This can cause problems with how different parts of your body function. What are the causes? This condition is caused by a decrease of oxygen supply to an area of the brain, which may be the result of:  A small blood clot (embolus) or a buildup of plaque in the blood vessels (atherosclerosis) that blocks blood flow in the brain.  An abnormal heart rhythm (atrial fibrillation).  A blocked or damaged artery in the head or neck. Sometimes the cause of stroke is not known (cryptogenic). What increases the risk? Certain factors may make you more likely to develop this condition. Some of these factors are things that you can change, such as:  Obesity.  Smoking cigarettes.  Taking oral birth control, especially if you also use tobacco.  Physical inactivity.  Excessive alcohol use.  Use of illegal drugs, especially cocaine and methamphetamine. Other risk factors include:  High blood pressure (hypertension).  High cholesterol.  Diabetes mellitus.  Heart disease.  Being [place] American, Native American, Hispanic, or [place] Native.  Being over age 49.  Family history of stroke.  Previous history of blood clots, stroke, or transient ischemic attack (TIA).  Sickle cell disease.  Being a woman with a history of preeclampsia.  Migraine headache.  Sleep apnea.  Irregular heartbeats, such as atrial  fibrillation.  Chronic inflammatory diseases, such as rheumatoid arthritis or lupus.  Blood clotting disorders (hypercoagulable state). What are the signs or symptoms? Symptoms of this condition usually develop suddenly, or you may notice them after waking up from sleep. Symptoms may include sudden:  Weakness or numbness in your face, arm, or leg, especially on one side of your body.  Trouble walking or difficulty moving your arms or legs.  Loss of balance or coordination.  Confusion.  Slurred speech (dysarthria).  Trouble speaking, understanding speech, or both (aphasia).  Vision changes--such as double vision, blurred vision, or loss of vision--in one or both eyes.  Dizziness.  Nausea and vomiting.  Severe headache with no known cause. The headache is often described as the worst headache ever experienced. If possible, make note of the exact time that you last felt like your normal self and what time your symptoms started. Tell your health care provider. If symptoms come and go, this could be a sign of a warning stroke, or TIA. Get help right away, even if you feel better. How is this diagnosed? This condition may be diagnosed based on:  Your symptoms, your medical history, and a physical exam.  CT scan of the brain.  MRI.  CT angiogram. This test uses a computer to take X-rays of your arteries. A dye may be injected into your blood to show the inside of your blood vessels more clearly.  MRI angiogram. This is a type of MRI that is used to evaluate the blood vessels.  Cerebral angiogram. This test uses X-rays and a dye to show the blood vessels in the brain  and neck. You may need to see a health care provider who specializes in stroke care. A stroke specialist can be seen in person or through communication using telephone or television technology (telemedicine). Other tests may also be done to find the cause of the stroke, such as:  Electrocardiogram  (ECG).  Continuous heart monitoring.  Echocardiogram.  Transesophageal echocardiogram (TEE).  Carotid ultrasound.  A scan of the brain circulation.  Blood tests.  Sleep study to check for sleep apnea. How is this treated? Treatment for this condition will depend on the duration, severity, and cause of your symptoms and on the area of the brain affected. It is very important to get treatment at the first sign of stroke symptoms. Some treatments work better if they are done within 3-6 hours of the onset of stroke symptoms. These initial treatments may include:  Aspirin.  Medicines to control blood pressure.  Medicine given by injection to dissolve the blood clot (thrombolytic).  Treatments given directly to the affected artery to remove or dissolve the blood clot. Other treatment options may include:  Oxygen.  IV fluids.  Medicines to thin the blood (anticoagulants or antiplatelets).  Procedures to increase blood flow. Medicines and changes to your diet may be used to help treat and manage risk factors for stroke, such as diabetes, high cholesterol, and high blood pressure. After a stroke, you may work with physical, speech, mental health, or occupational therapists to help you recover. Follow these instructions at home: Medicines  Take over-the-counter and prescription medicines only as told by your health care provider.  If you were told to take a medicine to thin your blood, such as aspirin or an anticoagulant, take it exactly as told by your health care provider. ? Taking too much blood-thinning medicine can cause bleeding. ? If you do not take enough blood-thinning medicine, you will not have the protection that you need against another stroke and other problems.  Understand the side effects of taking anticoagulant medicine. When taking this type of medicine, make sure you: ? Hold pressure over any cuts for longer than usual. ? Tell your dentist and other health care  providers that you are taking anticoagulants before you have any procedures that may cause bleeding. ? Avoid activities that may cause trauma or injury. Eating and drinking  Follow instructions from your health care provider about diet.  Eat healthy foods.  If your ability to swallow was affected by the stroke, you may need to take steps to avoid choking, such as: ? Taking small bites when eating. ? Eating foods that are soft or pureed. Safety  Follow instructions from your health care team about physical activity.  Use a walker or cane as told by your health care provider.  Take steps to create a safe home environment in order to reduce the risk of falls. This may include: ? Having your home looked at by specialists. ? Installing grab bars in the bedroom and bathroom. ? Using safety equipment, such as raised toilets and a seat in the shower. General instructions  Do not use any tobacco products, such as cigarettes, chewing tobacco, and e-cigarettes. If you need help quitting, ask your health care provider.  Limit alcohol intake to no more than 1 drink a day for nonpregnant women and 2 drinks a day for men. One drink equals 12 oz of beer, 5 oz of wine, or 1 oz of hard liquor.  If you need help to stop using drugs or alcohol, ask your  health care provider about a referral to a program or specialist.  Maintain an active and healthy lifestyle. Get regular exercise as told by your health care provider.  Keep all follow-up visits as told by your health care provider, including visits with all specialists on your health care team. This is important. How is this prevented? Your risk of another stroke can be decreased by managing high blood pressure, high cholesterol, diabetes, heart disease, sleep apnea, and obesity. It can also be decreased by quitting smoking, limiting alcohol, and staying physically active. Your health care provider will continue to work with you on measures to prevent  short-term and long-term complications of stroke. Get help right away if:   You have any symptoms of a stroke. "BE FAST" is an easy way to remember the main warning signs of a stroke: ? B - Balance. Signs are dizziness, sudden trouble walking, or loss of balance. ? E - Eyes. Signs are trouble seeing or a sudden change in vision. ? F - Face. Signs are sudden weakness or numbness of the face, or the face or eyelid drooping on one side. ? A - Arms. Signs are weakness or numbness in an arm. This happens suddenly and usually on one side of the body. ? S - Speech. Signs are sudden trouble speaking, slurred speech, or trouble understanding what people say. ? T - Time. Time to call emergency services. Write down what time symptoms started.  You have other signs of a stroke, such as: ? A sudden, severe headache with no known cause. ? Nausea or vomiting. ? Seizure.  These symptoms may represent a serious problem that is an emergency. Do not wait to see if the symptoms will go away. Get medical help right away. Call your local emergency services (911 in the [place] Do not drive yourself to the hospital. Summary  An ischemic stroke (cerebrovascular accident, or CVA) is the sudden death of brain tissue that occurs when an area of the brain does not get enough oxygen.  Symptoms of this condition usually develop suddenly, or you may notice them after waking up from sleep.  It is very important to get treatment at the first sign of stroke symptoms. Stroke is a medical emergency that must be treated right away. This information is not intended to replace advice given to you by your health care provider. Make sure you discuss any questions you have with your health care provider. Document Revised: 09/11/2017 Document Reviewed: 03/21/2015 Elsevier Patient Education  [place].    Physical Therapy After a Stroke After a stroke, some people experience physical changes or problems. Physical therapy  may be prescribed to help you recover and overcome problems such as:  Inability to move (paralysis) or weakness, typically affecting one side of the body.  Trouble with balance.  Pain, a pins and needles sensation, or numbness in certain parts of the body. You may also have difficulty feeling touch, pressure, or changes in temperature.  Involuntary muscle tightening (spasticity).  Stiffness in muscles and joints.  Altered coordination and reflexes. What causes physical disability after a stroke? A stroke can damage parts of your brain that control your body's normal functions, including your ability to move and to keep your balance. The types of physical problems you have will depend on how severe the stroke was and where it was located in the brain. Weakness or paralysis may affect just your fingers and hands, a whole leg or arm, or an entire side of  your body. What is physical therapy? Physical therapy involves using exercises, stretches, and activities to help you regain movement and independence after your stroke. Physical therapy may focus on one or more of the following:  Range of motion. This can help with movement and reduce muscle stiffness.  Balance. This helps to lower your risk of falling.  Position changes or transfers, such as moving from sitting to standing or from a chair to a bed.  Coordination, such as getting an object from a shelf.  Muscle strength. Muscles may be strengthened with weights or by repeating certain motions.  Functional mobility. This may include stair training or learning how to use a wheelchair, walker, or cane.  Walking (gait training).  Activities of daily living, such as getting out of the car or buttoning a shirt. Why is physical therapy important? It is important to do exercises and follow your rehabilitation plan as told by your physical therapist. Physical therapy can:  Help you regain independence.  Prevent injury from falls by  building strength and balance.  Lower your risk of blood clots.  Lower your risk of skin sores (pressure injuries).  Increase physical activity and exercise. This may help lower your risk for another stroke.  Help reduce pain. When will therapy start and where will I have therapy? Your health care provider will decide when it is best for you to start therapy. In some cases, people start rehabilitation, including physical therapy, as soon as they are medically stable, which may be 24-48 hours after a stroke. Rehabilitation can take place in a few different places, based on your needs. It may take place in:  The hospital or an in-patient rehabilitation hospital.  An outpatient rehabilitation facility.  A long-term care facility.  A community rehabilitation clinic.  Your home. What are assistive devices? Assistive devices are tools to help you move, maintain balance, and manage daily tasks while recovering from a stroke. Your physical therapist may recommend and help you learn to use:  Equipment to help you move, such as wheelchairs, canes, or walkers.  Braces or splints to keep your arms, hands, legs, or feet in a comfortable and safe position.  Bathtub benches or grab bars to keep you safe in the bathroom.  Special utensils, bowls, and plates that allow you to eat with one hand. It is important to use these devices as told by your health care provider. Summary  After a stroke, some people may experience physical disabilities, such as weakness or paralysis, pain, or balance problems.  Physical therapy involves exercises, stretches, and activities that help to improve your ability to move and to handle daily tasks.  Physical therapy exercises focus on restoring range of motion, balance, coordination, muscle strength, and the ability to move (mobility).  Physical therapy can help you regain independence, prevent falls, and allow you to live a more active lifestyle after a  stroke. This information is not intended to replace advice given to you by your health care provider. Make sure you discuss any questions you have with your health care provider. Document Revised: 04/15/2018 Document Reviewed: 03/31/2016 Elsevier Patient Education  [place].   Tobacco Use Disorder Tobacco use disorder (TUD) occurs when a person craves, seeks, and uses tobacco, regardless of the consequences. This disorder can cause problems with mental and physical health. It can affect your ability to have healthy relationships, and it can keep you from meeting your responsibilities at work, home, or school. Tobacco may be:  Smoked as  a cigarette or cigar.  Inhaled using e-cigarettes.  Smoked in a pipe or hookah.  Chewed as smokeless tobacco.  Inhaled into the nostrils as snuff. Tobacco products contain a dangerous chemical called nicotine, which is very addictive. Nicotine triggers hormones that make the body feel stimulated and works on areas of the brain that make you feel good. These effects can make it hard for people to quit nicotine. Tobacco contains many other unsafe chemicals that can damage almost every organ in the body. Smoking tobacco also puts others in danger due to fire risk and possible health problems caused by breathing in secondhand smoke. What are the signs or symptoms? Symptoms of TUD may include:  Being unable to slow down or stop your tobacco use.  Spending an abnormal amount of time getting or using tobacco.  Craving tobacco. Cravings may last for up to 6 months after quitting.  Tobacco use that: ? Interferes with your work, school, or home life. ? Interferes with your personal and social relationships. ? Makes you give up activities that you once enjoyed or found important.  Using tobacco even though you know that it is: ? Dangerous or bad for your health or someone else's health. ? Causing problems in your life.  Needing more and more of  the substance to get the same effect (developing tolerance).  Experiencing unpleasant symptoms if you do not use the substance (withdrawal). Withdrawal symptoms may include: ? Depressed, anxious, or irritable mood. ? Difficulty concentrating. ? Increased appetite. ? Restlessness or trouble sleeping.  Using the substance to avoid withdrawal. How is this diagnosed? This condition may be diagnosed based on:  Your current and past tobacco use. Your health care provider may ask questions about how your tobacco use affects your life.  A physical exam. You may be diagnosed with TUD if you have at least two symptoms within a 72-month period. How is this treated? This condition is treated by stopping tobacco use. Many people are unable to quit on their own and need help. Treatment may include:  Nicotine replacement therapy (NRT). NRT provides nicotine without the other harmful chemicals in tobacco. NRT gradually lowers the dosage of nicotine in the body and reduces withdrawal symptoms. NRT is available as: ? Over-the-counter gums, lozenges, and skin patches. ? Prescription mouth inhalers and nasal sprays.  Medicine that acts on the brain to reduce cravings and withdrawal symptoms.  A type of talk therapy that examines your triggers for tobacco use, how to avoid them, and how to cope with cravings (behavioral therapy).  Hypnosis. This may help with withdrawal symptoms.  Joining a support group for others coping with TUD. The best treatment for TUD is usually a combination of medicine, talk therapy, and support groups. Recovery can be a long process. Many people start using tobacco again after stopping (relapse). If you relapse, it does not mean that treatment will not work. Follow these instructions at home:  Lifestyle  Do not use any products that contain nicotine or tobacco, such as cigarettes and e-cigarettes.  Avoid things that trigger tobacco use as much as you can. Triggers include  people and situations that usually cause you to use tobacco.  Avoid drinks that contain caffeine, including coffee. These may worsen some withdrawal symptoms.  Find ways to manage stress. Wanting to smoke may cause stress, and stress can make you want to smoke. Relaxation techniques such as deep breathing, meditation, and yoga may help.  Attend support groups as needed. These groups are an  important part of long-term recovery for many people. General instructions  Take over-the-counter and prescription medicines only as told by your health care provider.  Check with your health care provider before taking any new prescription or over-the-counter medicines.  Decide on a friend, family member, or smoking quit-line (such as 1-800-QUIT-NOW in the [place] that you can call or text when you feel the urge to smoke or when you need help coping with cravings.  Keep all follow-up visits as told by your health care provider and therapist. This is important. Contact a health care provider if:  You are not able to take your medicines as prescribed.  Your symptoms get worse, even with treatment. Summary  Tobacco use disorder (TUD) occurs when a person craves, seeks, and uses tobacco regardless of the consequences.  This condition may be diagnosed based on your current and past tobacco use and a physical exam.  Many people are unable to quit on their own and need help. Recovery can be a long process.  The most effective treatment for TUD is usually a combination of medicine, talk therapy, and support groups. This information is not intended to replace advice given to you by your health care provider. Make sure you discuss any questions you have with your health care provider. Document Revised: 12/10/2016 Document Reviewed: 12/10/2016 Elsevier Patient Education  2020 [employer].

## 2019-05-09 NOTE — Consult Note (Addendum)
ELECTROPHYSIOLOGY CONSULT NOTE  Patient ID: Christian [name] MRN: [id number], DOB/AGE: 03-25-1945   Admit date: 05/07/2019 Date of Consult: 05/09/2019  Primary Physician: Susy Frizzle, MD Primary Cardiologist: none Reason for Consultation: Cryptogenic stroke - recommendations regarding Implantable Loop Recorder, requested by Dr. Erlinda Hong  History of Present Illness Christian [name] was admitted on 05/07/2019 with dizziness, weakness, found w/b/l strokes.    PMHx includes: CKD (III), HLD, smoker   Neurology notes  Multiple bilateral strokes - embolic pattern - concerning for cardioembolic source.   he has undergone workup for stroke including echocardiogram and carotid angio.  The patient has been monitored on telemetry which has demonstrated sinus rhythm with no arrhythmias.    Neurology has deferred TEE  Echocardiogram this admission demonstrated   IMPRESSIONS   1. Hyperdynamic LV systolic function; mild LVH; grade 1 diastolic  dysfunction.   2. Left ventricular ejection fraction, by estimation, is 65 to 70%. The  left ventricle has normal function. The left ventricle has no regional  wall motion abnormalities. There is mild left ventricular hypertrophy.  Left ventricular diastolic parameters  are consistent with Grade I diastolic dysfunction (impaired relaxation).   3. Right ventricular systolic function is normal. The right ventricular  size is normal.   4. The mitral valve is normal in structure. Trivial mitral valve  regurgitation. No evidence of mitral stenosis.   5. The aortic valve is tricuspid. Aortic valve regurgitation is not  visualized. No aortic stenosis is present.   6. The inferior vena cava is normal in size with greater than 50%  respiratory variability, suggesting right atrial pressure of 3 mmHg.   Lab work is reviewed.  Prior to admission, the patient denies chest pain, shortness of breath, dizziness, palpitations, or syncope.  He is recovering from their stroke  with plans to home at discharge.      Past Medical History:  Diagnosis Date   Adenomatous colon polyp    Allergy    seasonal   CKD (chronic kidney disease) stage 3, GFR 30-59 ml/min    Hyperlipidemia    Hypertriglyceridemia    PVD (peripheral vascular disease) (HCC)    decreased TBI bilaterally   Smoker      Surgical History:  Past Surgical History:  Procedure Laterality Date   CHOLECYSTECTOMY  1987   EYE SURGERY     lasik     Facility-Administered Medications Prior to Admission  Medication Dose Route Frequency Provider Last Rate Last Admin   0.9 %  sodium chloride infusion  500 mL Intravenous Continuous Danis, Estill Cotta III, MD       Medications Prior to Admission  Medication Sig Dispense Refill Last Dose   aspirin EC 81 MG tablet Take 81 mg by mouth daily.   05/06/2019 at Unknown time   atorvastatin (LIPITOR) 40 MG tablet TAKE 1 TABLET BY MOUTH EVERY DAY (Patient taking differently: Take 40 mg by mouth daily. ) 90 tablet 3 05/06/2019 at Unknown time   cholecalciferol (VITAMIN D) 1000 UNITS tablet Take 1,000 Units by mouth daily.   05/06/2019 at Unknown time   diazepam (VALIUM) 5 MG tablet Take 1 tablet (5 mg total) by mouth every 12 (twelve) hours as needed for anxiety. 30 tablet 1 unknown at unknown   fluticasone (FLONASE) 50 MCG/ACT nasal spray PLACE 2 SPRAYS INTO EACH NOSTRIL DAILY (Patient taking differently: Place 2 sprays into both nostrils daily. ) 48 mL 2 05/06/2019 at Unknown time   loratadine (CLARITIN) 10 MG tablet Take 10  mg by mouth daily.   05/06/2019 at Unknown time   Multiple Vitamins-Minerals (MULTIVITAMIN WITH MINERALS) tablet Take 1 tablet by mouth daily.   05/06/2019 at Unknown time   vitamin C (ASCORBIC ACID) 500 MG tablet Take 500 mg by mouth daily.   05/06/2019 at Unknown time   fenofibrate 160 MG tablet TAKE 1 TABLET BY MOUTH EVERY DAY (Patient not taking: Reported on 05/07/2019) 90 tablet 3 Not Taking at Unknown time    Inpatient Medications:   vitamin C   500 mg Oral Daily   aspirin EC  325 mg Oral Daily   atorvastatin  40 mg Oral Daily   cholecalciferol  1,000 Units Oral Daily   clopidogrel  75 mg Oral Daily   fluticasone  2 spray Each Nare Daily   heparin  5,000 Units Subcutaneous Q8H   loratadine  10 mg Oral Daily   multivitamin with minerals  1 tablet Oral Daily   sodium chloride flush  3 mL Intravenous Once    Allergies: No Known Allergies  Social History   Socioeconomic History   Marital status: Married    Spouse name: Not on file   Number of children: Not on file   Years of education: Not on file   Highest education level: Not on file  Occupational History   Not on file  Tobacco Use   Smoking status: Current Every Day Smoker    Packs/day: 0.25    Types: Cigarettes   Smokeless tobacco: Never Used  Substance and Sexual Activity   Alcohol use: Yes    Alcohol/week: 14.0 standard drinks    Types: 7 Shots of liquor, 7 Standard drinks or equivalent per week    Comment: 1 drink per day per pt   Drug use: No   Sexual activity: Not on file  Other Topics Concern   Not on file  Social History Narrative   Not on file   Social Determinants of Health   Financial Resource Strain:    Difficulty of Paying Living Expenses:   Food Insecurity:    Worried About Charity fundraiser in the Last Year:    Arboriculturist in the Last Year:   Transportation Needs:    Film/video editor (Medical):    Lack of Transportation (Non-Medical):   Physical Activity:    Days of Exercise per Week:    Minutes of Exercise per Session:   Stress:    Feeling of Stress :   Social Connections:    Frequency of Communication with Friends and Family:    Frequency of Social Gatherings with Friends and Family:    Attends Religious Services:    Active Member of Clubs or Organizations:    Attends Music therapist:    Marital Status:   Intimate Partner Violence:    Fear of Current or Ex-Partner:    Emotionally Abused:    Physically  Abused:    Sexually Abused:      Family History  Problem Relation Age of Onset   Hypertension Mother    Stroke Mother    Diabetes Mother        borderline   Arthritis Father    ALS Son    Colon cancer Neg Hx    Esophageal cancer Neg Hx    Rectal cancer Neg Hx    Stomach cancer Neg Hx       Review of Systems: All other systems reviewed and are otherwise negative except as noted above.  Physical Exam: Vitals:   05/08/19 1938 05/09/19 0012 05/09/19 0418 05/09/19 0726  BP: (!) 141/80 (!) 165/86 (!) 163/80 (!) 154/90  Pulse: 68 68 63 63  Resp: 18  18 18   Temp: 97.6 F (36.4 C) 97.6 F (36.4 C) 98.1 F (36.7 C) 98.2 F (36.8 C)  TempSrc: Oral Oral Oral Oral  SpO2: 99% 98% 97% 99%  Weight:      Height:        GEN- The patient is well appearing, alert and oriented x 3 today.   Head- normocephalic, atraumatic Eyes-  Sclera clear, conjunctiva pink Ears- hearing intact Oropharynx- clear Neck- supple Lungs- CTA b/l, normal work of breathing Heart- RRR, no murmurs, rubs or gallops  GI- soft, NT, ND Extremities- no clubbing, cyanosis, or edema MS- no significant deformity or atrophy Skin- no rash or lesion Psych- euthymic mood, full affect   Labs:   Lab Results  Component Value Date   WBC 9.5 05/09/2019   HGB 14.2 05/09/2019   HCT 42.4 05/09/2019   MCV 89.6 05/09/2019   PLT 255 05/09/2019    Recent Labs  Lab 05/07/19 1449 05/07/19 1451 05/09/19 0319  NA 137   < > 141  K 3.7   < > 4.2  CL 103   < > 107  CO2 19*   < > 30  BUN 20   < > 17  CREATININE 1.19   < > 1.30*  CALCIUM 9.1   < > 8.9  PROT 6.5  --   --   BILITOT 0.9  --   --   ALKPHOS 120  --   --   ALT 39  --   --   AST 29  --   --   GLUCOSE 122*   < > 107*   < > = values in this interval not displayed.   No results found for: CKTOTAL, CKMB, CKMBINDEX, TROPONINI Lab Results  Component Value Date   CHOL 144 05/08/2019   CHOL 125 02/09/2019   CHOL 120 08/06/2018   Lab Results  Component  Value Date   HDL 59 05/08/2019   HDL 55 02/09/2019   HDL 54 08/06/2018   Lab Results  Component Value Date   LDLCALC 62 05/08/2019   LDLCALC 53 02/09/2019   LDLCALC 50 08/06/2018   Lab Results  Component Value Date   TRIG 117 05/08/2019   TRIG 85 02/09/2019   TRIG 78 08/06/2018   Lab Results  Component Value Date   CHOLHDL 2.4 05/08/2019   CHOLHDL 2.3 02/09/2019   CHOLHDL 2.2 08/06/2018   No results found for: LDLDIRECT  No results found for: DDIMER   Radiology/Studies: CT Code Stroke CTA Head W/WO contrast  Result Date: 05/07/2019 CLINICAL DATA:  Acute neuro deficit. Dysarthria, dizziness, facial droop, nausea and vomiting EXAM: CT ANGIOGRAPHY HEAD AND NECK TECHNIQUE: Multidetector CT imaging of the head and neck was performed using the standard protocol during bolus administration of intravenous contrast. Multiplanar CT image reconstructions and MIPs were obtained to evaluate the vascular anatomy. Carotid stenosis measurements (when applicable) are obtained utilizing NASCET criteria, using the distal internal carotid diameter as the denominator. CONTRAST:  59mL OMNIPAQUE IOHEXOL 350 MG/ML SOLN COMPARISON:  CT head 05/07/2019 FINDINGS: CTA NECK FINDINGS Aortic arch: Standard branching. Proximal great vessels widely patent. Mild atherosclerotic calcification aortic arch. Right carotid system: Mild atherosclerotic calcification right carotid bifurcation without significant stenosis. Left carotid system: Atherosclerotic calcification left carotid bulb without significant stenosis. Vertebral arteries: Left  vertebral dominant. Left vertebral artery widely patent to the basilar without stenosis. Right vertebral artery is patent to the C1 level where there is tapered occlusion with ill-defined margins suggesting acute thrombus. No atherosclerotic disease at this level. Skeleton: Cervical spondylosis.  No acute skeletal abnormality. Other neck: Negative Upper chest: Lung apices clear bilaterally.  Review of the MIP images confirms the above findings CTA HEAD FINDINGS Anterior circulation: Cavernous carotid widely patent bilaterally without stenosis. Anterior and middle cerebral arteries widely patent bilaterally. Hyperdense right M1 segment on the CT does not correlate with large vessel occlusion. Posterior circulation: Occlusion of the distal right vertebral artery at the C1 level. This is likely acute with ill-defined margins. There is reconstitution of the very distal portion of the right vertebral artery. Left vertebral artery is patent to the basilar. PICA patent bilaterally. Basilar widely patent. Superior cerebellar and posterior cerebral arteries patent bilaterally without stenosis. Venous sinuses: Normal venous enhancement. Anatomic variants: None Review of the MIP images confirms the above findings IMPRESSION: 1. Occlusion of the distal right vertebral artery at the C1 level through the V4 level. This has ill-defined margins and likely is acute either due dissection or less likely atherosclerotic disease. No significant atherosclerotic calcification is seen in the area. 2. Mild atherosclerotic disease carotid bifurcation bilaterally without stenosis. Left vertebral artery patent to the basilar without stenosis. 3. No evidence of right M2 large vessel occlusion. Electronically Signed   By: Franchot Gallo M.D.   On: 05/07/2019 15:29   CT Code Stroke CTA Neck W/WO contrast  Result Date: 05/07/2019 CLINICAL DATA:  Acute neuro deficit. Dysarthria, dizziness, facial droop, nausea and vomiting EXAM: CT ANGIOGRAPHY HEAD AND NECK TECHNIQUE: Multidetector CT imaging of the head and neck was performed using the standard protocol during bolus administration of intravenous contrast. Multiplanar CT image reconstructions and MIPs were obtained to evaluate the vascular anatomy. Carotid stenosis measurements (when applicable) are obtained utilizing NASCET criteria, using the distal internal carotid diameter as  the denominator. CONTRAST:  45mL OMNIPAQUE IOHEXOL 350 MG/ML SOLN COMPARISON:  CT head 05/07/2019 FINDINGS: CTA NECK FINDINGS Aortic arch: Standard branching. Proximal great vessels widely patent. Mild atherosclerotic calcification aortic arch. Right carotid system: Mild atherosclerotic calcification right carotid bifurcation without significant stenosis. Left carotid system: Atherosclerotic calcification left carotid bulb without significant stenosis. Vertebral arteries: Left vertebral dominant. Left vertebral artery widely patent to the basilar without stenosis. Right vertebral artery is patent to the C1 level where there is tapered occlusion with ill-defined margins suggesting acute thrombus. No atherosclerotic disease at this level. Skeleton: Cervical spondylosis.  No acute skeletal abnormality. Other neck: Negative Upper chest: Lung apices clear bilaterally. Review of the MIP images confirms the above findings CTA HEAD FINDINGS Anterior circulation: Cavernous carotid widely patent bilaterally without stenosis. Anterior and middle cerebral arteries widely patent bilaterally. Hyperdense right M1 segment on the CT does not correlate with large vessel occlusion. Posterior circulation: Occlusion of the distal right vertebral artery at the C1 level. This is likely acute with ill-defined margins. There is reconstitution of the very distal portion of the right vertebral artery. Left vertebral artery is patent to the basilar. PICA patent bilaterally. Basilar widely patent. Superior cerebellar and posterior cerebral arteries patent bilaterally without stenosis. Venous sinuses: Normal venous enhancement. Anatomic variants: None Review of the MIP images confirms the above findings IMPRESSION: 1. Occlusion of the distal right vertebral artery at the C1 level through the V4 level. This has ill-defined margins and likely is acute either due dissection or  less likely atherosclerotic disease. No significant atherosclerotic  calcification is seen in the area. 2. Mild atherosclerotic disease carotid bifurcation bilaterally without stenosis. Left vertebral artery patent to the basilar without stenosis. 3. No evidence of right M2 large vessel occlusion. Electronically Signed   By: Franchot Gallo M.D.   On: 05/07/2019 15:29   MR BRAIN WO CONTRAST  Result Date: 05/07/2019 CLINICAL DATA:  Stroke. EXAM: MRI HEAD WITHOUT CONTRAST TECHNIQUE: Multiplanar, multiecho pulse sequences of the brain and surrounding structures were obtained without intravenous contrast. COMPARISON:  CT angio head neck today FINDINGS: Brain: Acute infarct in the right cerebellar tonsil, right posterior cerebellum, and left pons. In addition, there is a small acute infarct in the left parietal lobe. Negative for hemorrhage or mass. Ventricle size normal. Scattered small white matter hyperintensities compatible with mild chronic microvascular ischemia. Chronic ischemic change in the pons left greater than right. Vascular: Occlusion distal right vertebral artery with hyperintense thrombus as noted on CTA. Distal left vertebral artery and basilar artery and both internal carotid artery showed normal arterial flow voids. Skull and upper cervical spine: No focal skeletal lesion. Sinuses/Orbits: Mucosal edema paranasal sinuses.  Negative orbit Other: None IMPRESSION: Acute infarct in the right cerebellar tonsil, right cerebellum, and left pons. Small acute infarct in the left parietal lobe. Occlusion distal right vertebral artery which appears acute on CTA. Electronically Signed   By: Franchot Gallo M.D.   On: 05/07/2019 16:45      CT HEAD CODE STROKE WO CONTRAST  Result Date: 05/07/2019 CLINICAL DATA:  Code stroke. Facial droop dizziness nausea vomiting. Dysarthria. EXAM: CT HEAD WITHOUT CONTRAST TECHNIQUE: Contiguous axial images were obtained from the base of the skull through the vertex without intravenous contrast. COMPARISON:  None. FINDINGS: Brain: Mild atrophy  and mild chronic microvascular ischemic changes in the white matter. No acute cortical infarct. Negative for hemorrhage or mass. Vascular: Possible hyperdense right M2 branch in the sylvian fissure. Correlate with CTA findings. Skull: Negative Sinuses/Orbits: Mucosal edema paranasal sinuses.  Negative orbit. Other: None ASPECTS ([place] Stroke Program Early CT Score) - Ganglionic level infarction (caudate, lentiform nuclei, internal capsule, insula, M1-M3 cortex): 7 - Supraganglionic infarction (M4-M6 cortex): 3 Total score (0-10 with 10 being normal): 10 IMPRESSION: 1. No acute abnormality. Atrophy and chronic microvascular ischemia. 2. ASPECTS is 10 3. Possible hyperdense right M2 segment in the sylvian fissure. Correlate with CTA findings. 4. Preliminary report texted to Dr. Lorraine Lax on  Shea Evans Electronically Signed   By: Franchot Gallo M.D.   On: 05/07/2019 15:09    12-lead ECG none in Epic or shadow chart All prior EKG's in EPIC reviewed with no documented atrial fibrillation  Telemetry SR  Assessment and Plan:  1. Cryptogenic stroke The patient presents with cryptogenic stroke.   I spoke at length with the patient about monitoring for afib with either a 30 day event monitor or an implantable loop recorder.  Risks, benefits, and alteratives to implantable loop recorder were discussed with the patient today.   At this time, the patient is very clear in their decision to proceed with implantable loop recorder.   Wound care was reviewed with the patient (keep incision clean and dry for 3 days).  Wound check will be scheduled for the patient  Please call with questions.   Renee Dyane Dustman, PA-C 05/09/2019   EP Attending Patient seen and examined. Agree with the findings as noted above. He has had a cryptogenic stroke and I have discussed the indications/risks/benefits/goals/expectations of  ILR insertion and he wishes to proceed.  Mikle Bosworth.D.

## 2019-05-10 ENCOUNTER — Telehealth: Payer: Self-pay | Admitting: Internal Medicine

## 2019-05-10 NOTE — Telephone Encounter (Signed)
Patient is calling in regards to loop recorder insertion completed on yesterday, 05/09/19. He states he has had some redness at bandage over incision and he assumes the redness is due to excess bleeding. Please call to discuss.

## 2019-05-10 NOTE — Telephone Encounter (Signed)
Spoke with patient. Pt reports that occlusive dressing was marked prior to his discharge yesterday. This morning, dressing was saturated with bright red blood but still intact. Advised pt to hold pressure with clean, dry towel for 15-20 minutes. Advised to keep occlusive dressing on until tomorrow to help ensure steri-strips stay in place. Encouraged call back to direct DC number if any additional wound concerns prior to wound check on 05/19/19. Pt verbalizes understanding and agreement with plan, denies further questions at this time.

## 2019-05-16 ENCOUNTER — Other Ambulatory Visit: Payer: Self-pay

## 2019-05-16 ENCOUNTER — Ambulatory Visit (INDEPENDENT_AMBULATORY_CARE_PROVIDER_SITE_OTHER): Payer: Medicare Other | Admitting: Family Medicine

## 2019-05-16 ENCOUNTER — Encounter: Payer: Self-pay | Admitting: Family Medicine

## 2019-05-16 VITALS — BP 110/70 | HR 78 | Temp 96.6°F | Resp 16 | Ht 69.0 in | Wt 166.0 lb

## 2019-05-16 DIAGNOSIS — I6930 Unspecified sequelae of cerebral infarction: Secondary | ICD-10-CM

## 2019-05-16 DIAGNOSIS — I639 Cerebral infarction, unspecified: Secondary | ICD-10-CM

## 2019-05-16 DIAGNOSIS — Z09 Encounter for follow-up examination after completed treatment for conditions other than malignant neoplasm: Secondary | ICD-10-CM | POA: Diagnosis not present

## 2019-05-16 DIAGNOSIS — Z720 Tobacco use: Secondary | ICD-10-CM | POA: Diagnosis not present

## 2019-05-16 LAB — CBC WITH DIFFERENTIAL/PLATELET
Absolute Monocytes: 672 cells/uL (ref 200–950)
Basophils Absolute: 111 cells/uL (ref 0–200)
Basophils Relative: 1.3 %
Eosinophils Absolute: 400 cells/uL (ref 15–500)
Eosinophils Relative: 4.7 %
HCT: 47.7 % (ref 38.5–50.0)
Hemoglobin: 15.5 g/dL (ref 13.2–17.1)
Lymphs Abs: 2950 cells/uL (ref 850–3900)
MCH: 29.2 pg (ref 27.0–33.0)
MCHC: 32.5 g/dL (ref 32.0–36.0)
MCV: 90 fL (ref 80.0–100.0)
MPV: 10 fL (ref 7.5–12.5)
Monocytes Relative: 7.9 %
Neutro Abs: 4369 cells/uL (ref 1500–7800)
Neutrophils Relative %: 51.4 %
Platelets: 301 10*3/uL (ref 140–400)
RBC: 5.3 10*6/uL (ref 4.20–5.80)
RDW: 13.2 % (ref 11.0–15.0)
Total Lymphocyte: 34.7 %
WBC: 8.5 10*3/uL (ref 3.8–10.8)

## 2019-05-16 LAB — COMPLETE METABOLIC PANEL WITH GFR
AG Ratio: 1.9 (calc) (ref 1.0–2.5)
ALT: 76 U/L — ABNORMAL HIGH (ref 9–46)
AST: 30 U/L (ref 10–35)
Albumin: 4.1 g/dL (ref 3.6–5.1)
Alkaline phosphatase (APISO): 127 U/L (ref 35–144)
BUN/Creatinine Ratio: 16 (calc) (ref 6–22)
BUN: 20 mg/dL (ref 7–25)
CO2: 27 mmol/L (ref 20–32)
Calcium: 9.3 mg/dL (ref 8.6–10.3)
Chloride: 105 mmol/L (ref 98–110)
Creat: 1.22 mg/dL — ABNORMAL HIGH (ref 0.70–1.18)
GFR, Est African American: 67 mL/min/{1.73_m2} (ref >=60)
GFR, Est Non African American: 58 mL/min/{1.73_m2} — ABNORMAL LOW (ref >=60)
Globulin: 2.2 g/dL (calc) (ref 1.9–3.7)
Glucose, Bld: 102 mg/dL — ABNORMAL HIGH (ref 65–99)
Potassium: 4.9 mmol/L (ref 3.5–5.3)
Sodium: 140 mmol/L (ref 135–146)
Total Bilirubin: 0.6 mg/dL (ref 0.2–1.2)
Total Protein: 6.3 g/dL (ref 6.1–8.1)

## 2019-05-16 MED ORDER — CLOPIDOGREL BISULFATE 75 MG PO TABS: 75.0000 mg | ORAL_TABLET | Freq: Every day | ORAL | 3 refills | Status: DC

## 2019-05-16 NOTE — Progress Notes (Signed)
Subjective:     Patient ID: Christian [name], male   DOB: [birth year], 74 y.o.   MRN: [id number]  HPI Unfortunately, patient was recently admitted 5/1-5/3 with CVA.  I have copied relevant portions of the discharge summary below for my reference:  Tests Needing Follow-up: -assess BP control  -recheck renal function -encourage tobacco cessation  -assure tolerance of ASA + Plavix  Discharge Diagnoses: Multiple bilateral embolic type CVAs HTN HLD CKD stage II Tobacco abuse  Initial presentation: 74 year old with a history of HLD, PVD, CKD stage III, and tobacco abuse who suffered the acute onset of diaphoresis, dizziness, nausea, left-sided facial droop, and left-sided weakness. EMS was summoned and the patient was taken to the ED.  Hospital Course: Multiple bilateral embolic type CVAs Pattern worrisome for cardioembolic source-CT head with no acute abnormality-CTanoted occlusion of the distal right vertebral artery at the C1 level through the V4 level-MRI noted acute infarct right cerebellar tonsil, right cerebellum, and left pons with small acute infarct in the left parietal lobe-neurology guided evaluation-Loop recorder placed prior to d/c-was on aspirin 81 mg prior to admit-now on aspirin 325 mg plus Plavix x3 months then Plavix alone - no evidence of DVT B LE on venous duplex - pt preferred outpt PT/OT so these orders were placed   HTN Was not on meds prior to admission -practice permissive hypertension in setting of acute CVA during hospital stay - f/u BP in outpt setting w/ long term goal being normalization of BP  HLD LDL 62-continue Lipitor 40 mg daily  CKD stage II Creatinine stable during this hospital stay  Tobacco abuse Counseled on smoking cessation  Patient is here today for follow-up.  He states that he was in the [employer] with friends when suddenly he became dizzy with vertigo.  He experienced double vision and was experiencing  dysarthria along with a left facial droop.  He fell to the floor.  911 was called and he was sent to the hospital.  Work-up revealed cryptogenic stroke with a pattern concerning for cardioembolic source.  He is taking his aspirin and Plavix now as directed.  He does have the implantable loop recorder placed and is waiting for the results of this.  Today he denies any palpitations or tachyarrhythmias.  He denies any syncope.  He continues to have a right-sided posterior headache in the exact location where the vertebral artery is occluded.  His blood pressure today is outstanding and his permissive hypertension has resolved.  He continues to take a statin along with his dual antiplatelet therapy.  He is trying to cut back on smoking however he continues to smoke.  He feels a little woozy when he walks but otherwise is back to his baseline.  The diplopia and dysarthria have totally resolved. Past Medical History:  Diagnosis Date  . Adenomatous colon polyp   . Allergy    seasonal  . CKD (chronic kidney disease) stage 3, GFR 30-59 ml/min   . Hyperlipidemia   . Hypertriglyceridemia   . PVD (peripheral vascular disease) (HCC)    decreased TBI bilaterally  . Smoker    Past Surgical History:  Procedure Laterality Date  . CHOLECYSTECTOMY  1987  . EYE SURGERY     lasik  . LOOP RECORDER INSERTION N/A 05/09/2019   Procedure: LOOP RECORDER INSERTION;  Surgeon: Evans Lance, MD;  Location: [facility];  Service: Cardiovascular;  Laterality: N/A;   Current Outpatient Medications on File Prior to Visit  Medication Sig Dispense  Refill  . acetaminophen (TYLENOL) 325 MG tablet Take 2 tablets (650 mg total) by mouth every 4 (four) hours as needed for mild pain (or temp > 37.5 C (99.5 F)).    Marland Kitchen aspirin EC 325 MG EC tablet Take 1 tablet (325 mg total) by mouth daily. 30 tablet 0  . atorvastatin (LIPITOR) 40 MG tablet TAKE 1 TABLET BY MOUTH EVERY DAY (Patient taking differently: Take 40 mg by mouth daily.  ) 90 tablet 3  . cholecalciferol (VITAMIN D) 1000 UNITS tablet Take 1,000 Units by mouth daily.    . diazepam (VALIUM) 5 MG tablet Take 1 tablet (5 mg total) by mouth every 12 (twelve) hours as needed for anxiety. 30 tablet 1  . fluticasone (FLONASE) 50 MCG/ACT nasal spray PLACE 2 SPRAYS INTO EACH NOSTRIL DAILY (Patient taking differently: Place 2 sprays into both nostrils daily. ) 48 mL 2  . loratadine (CLARITIN) 10 MG tablet Take 10 mg by mouth daily.    . Multiple Vitamins-Minerals (MULTIVITAMIN WITH MINERALS) tablet Take 1 tablet by mouth daily.    . vitamin C (ASCORBIC ACID) 500 MG tablet Take 500 mg by mouth daily.     No current facility-administered medications on file prior to visit.   No Known Allergies Social History   Socioeconomic History  . Marital status: Married    Spouse name: Not on file  . Number of children: Not on file  . Years of education: Not on file  . Highest education level: Not on file  Occupational History  . Not on file  Tobacco Use  . Smoking status: Current Every Day Smoker    Packs/day: 0.25    Types: Cigarettes  . Smokeless tobacco: Never Used  Substance and Sexual Activity  . Alcohol use: Yes    Alcohol/week: 14.0 standard drinks    Types: 7 Shots of liquor, 7 Standard drinks or equivalent per week    Comment: 1 drink per day per pt  . Drug use: No  . Sexual activity: Not on file  Other Topics Concern  . Not on file  Social History Narrative  . Not on file   Social Determinants of Health   Financial Resource Strain:   . Difficulty of Paying Living Expenses:   Food Insecurity:   . Worried About Charity fundraiser in the Last Year:   . Arboriculturist in the Last Year:   Transportation Needs:   . Film/video editor (Medical):   Marland Kitchen Lack of Transportation (Non-Medical):   Physical Activity:   . Days of Exercise per Week:   . Minutes of Exercise per Session:   Stress:   . Feeling of Stress :   Social Connections:   . Frequency of  Communication with Friends and Family:   . Frequency of Social Gatherings with Friends and Family:   . Attends Religious Services:   . Active Member of Clubs or Organizations:   . Attends Archivist Meetings:   Marland Kitchen Marital Status:   Intimate Partner Violence:   . Fear of Current or Ex-Partner:   . Emotionally Abused:   Marland Kitchen Physically Abused:   . Sexually Abused:      Review of Systems  All other systems reviewed and are negative.      Objective:   Physical Exam Vitals reviewed.  Constitutional:      General: He is not in acute distress.    Appearance: Normal appearance. He is normal weight. He is not ill-appearing,  toxic-appearing or diaphoretic.  Cardiovascular:     Rate and Rhythm: Normal rate and regular rhythm.     Heart sounds: Normal heart sounds. No murmur. No friction rub. No gallop.   Pulmonary:     Effort: Pulmonary effort is normal. No respiratory distress.     Breath sounds: Normal breath sounds. No wheezing, rhonchi or rales.  Chest:     Chest wall: No tenderness.  Abdominal:     General: Abdomen is flat. Bowel sounds are normal. There is no distension.     Palpations: Abdomen is soft.     Tenderness: There is no abdominal tenderness. There is no guarding.  Musculoskeletal:     Right lower leg: No edema.     Left lower leg: No edema.  Neurological:     General: No focal deficit present.     Mental Status: He is alert and oriented to person, place, and time. Mental status is at baseline.     Cranial Nerves: No cranial nerve deficit.     Sensory: No sensory deficit.     Motor: No weakness.     Coordination: Coordination normal.     Gait: Gait normal.     Deep Tendon Reflexes: Reflexes normal.        Assessment:     Cryptogenic stroke ([facility]) - Plan: CBC with Differential/Platelet, COMPLETE METABOLIC PANEL WITH GFR  Hospital discharge follow-up - Plan: CBC with Differential/Platelet, COMPLETE METABOLIC PANEL WITH GFR  Tobacco abuse - Plan: CBC  with Differential/Platelet, COMPLETE METABOLIC PANEL WITH GFR      Plan:     Strongly strongly strongly encourage smoking cessation.  Explained the difference between cardioembolic stroke versus atypical ischemic stroke.  Right now he is on dual antiplatelet therapy treating atypical ischemic stroke.  However the implantable loop recorder is evaluating for atrial fibrillation as a potential source of cardioembolic stroke.  Patient will continue dual antiplatelet therapy for 3 months and then Plavix indefinitely afterwards unless laboratory reveals atrial fibrillation at which point he will require an anticoagulant such as Eliquis.  Continue statin.  Blood pressure today is well controlled.  Strongly emphasized the need for smoking cessation.  Otherwise the patient is doing remarkably well and demonstrates no other abnormalities that would require restrictions from driving or travel.

## 2019-05-17 ENCOUNTER — Ambulatory Visit: Payer: Medicare Other | Attending: Internal Medicine

## 2019-05-17 ENCOUNTER — Encounter: Payer: Self-pay | Admitting: Occupational Therapy

## 2019-05-17 ENCOUNTER — Ambulatory Visit: Payer: Medicare Other | Admitting: Occupational Therapy

## 2019-05-17 DIAGNOSIS — R2689 Other abnormalities of gait and mobility: Secondary | ICD-10-CM | POA: Diagnosis not present

## 2019-05-17 DIAGNOSIS — M6281 Muscle weakness (generalized): Secondary | ICD-10-CM | POA: Diagnosis not present

## 2019-05-17 DIAGNOSIS — I69354 Hemiplegia and hemiparesis following cerebral infarction affecting left non-dominant side: Secondary | ICD-10-CM | POA: Insufficient documentation

## 2019-05-17 DIAGNOSIS — R2681 Unsteadiness on feet: Secondary | ICD-10-CM | POA: Insufficient documentation

## 2019-05-17 NOTE — Therapy (Signed)
[facility] [address] [place], [place], [address] Phone: [phone]   Fax:  [phone]  Occupational Therapy Evaluation  Patient Details  Name: Christian [name] MRN: [id number] Date of Birth: May 20, 1945 Referring Provider (OT): Dr. Joette Catching   Encounter Date: 05/17/2019  OT End of Session - 05/17/19 1127    Visit Number  1    Number of Visits  1    Date for OT Re-Evaluation  --   eval only   Authorization Type  [employer] Medicare    OT Start Time  1102    OT Stop Time  1124    OT Time Calculation (min)  22 min    Activity Tolerance  Patient tolerated treatment well    Behavior During Therapy  [facility] for tasks assessed/performed       Past Medical History:  Diagnosis Date  . Adenomatous colon polyp   . Allergy    seasonal  . CKD (chronic kidney disease) stage 3, GFR 30-59 ml/min   . Hyperlipidemia   . Hypertriglyceridemia   . PVD (peripheral vascular disease) (HCC)    decreased TBI bilaterally  . Smoker     Past Surgical History:  Procedure Laterality Date  . CHOLECYSTECTOMY  1987  . EYE SURGERY     lasik  . LOOP RECORDER INSERTION N/A 05/09/2019   Procedure: LOOP RECORDER INSERTION;  Surgeon: Evans Lance, MD;  Location: [facility];  Service: Cardiovascular;  Laterality: N/A;    There were no vitals filed for this visit.  Subjective Assessment - 05/17/19 1103    Subjective   wife reports that pt is slightly slower, pt reports he is just being cautious (wife agrees)    Pertinent History  CVA.  PMH:  HLD, PVD, CKD stage III    Patient Stated Goals  pt feels like he is back at baseline    Currently in Pain?  No/denies        [facility] OT Assessment - 05/17/19 0001      Assessment   Medical Diagnosis  CVA    Referring Provider (OT)  Dr. Joette Catching    Onset Date/Surgical Date  05/07/19    Hand Dominance  Right    Prior Therapy  acute       Precautions   Precautions  None       Balance Screen   Has the patient fallen in the past 6 months  No      Prior Function   Level of Independence  Independent    Vocation  Retired    Fish farm manager, being outside, watching sports, playing games on computer      ADL   ADL comments  Pt reports BADLs mod I      IADL   Prior Level of Function Shopping  --   has not yet attempted   Prior Level of Function Light Housekeeping  pt did yardwork prior; but has not yet performed    Prior Level of Function Meal Prep  wife performed cooking prior; pt grilled this weekend    Prior Level of Function Sales executive  --   has not yet attempted   Medication Management  Is responsible for taking medication in correct dosages at correct time      Mobility   Mobility Status  Independent    Mobility Status Comments  see PT eval for details; pt reports walking outside, going upstairs  Written Expression   Dominant Hand  Right      Vision - History   Additional Comments  pt denies changes      Activity Tolerance   Activity Tolerance Comments  denies fatigue after visiting with grandchildren      Sensation   Additional Comments  pt denies change      Coordination   9 Hole Peg Test  Right;Left    Right 9 Hole Peg Test  31.19    Left 9 Hole Peg Test  27.65      ROM / Strength   AROM / PROM / Strength  Strength;AROM      AROM   Overall AROM   Within functional limits for tasks performed   BUEs     Strength   Overall Strength  Within functional limits for tasks performed    Overall Strength Comments  BUEs proximal strength grossly 5/5      Hand Function   Right Hand Grip (lbs)  84.6    Left Hand Grip (lbs)  83.1                      OT Education - 05/17/19 1136    Education Details  OT evaluation findings and recommendation to slowly incr activity as pt feels comfortable    Person(s) Educated  Patient;Spouse    Methods  Explanation    Comprehension   Verbalized understanding                 Plan - 05/17/19 1128    Clinical Impression Statement  Pt is a 74 y.o. male s/p CVA 05/07/19 who was discharged from hospital 05/09/19.   Pt with PMH that includesHLD, PVD, CKD stage III.  Pt was independent prior to CVA.  Pt feels like he is back to baseline, but has not yet returned to driving or yardwork.    Recommended that pt continue to slowly incr activity as he feels comfortable (pt has been doing this).  No further occupational therapy needed at this time.    OT Occupational Profile and History  Problem Focused Assessment - Including review of records relating to presenting problem    Occupational performance deficits (Please refer to evaluation for details):  IADL's    Body Structure / Function / Physical Skills  Endurance    Clinical Decision Making  Limited treatment options, no task modification necessary    Comorbidities Affecting Occupational Performance:  None    Modification or Assistance to Complete Evaluation   No modification of tasks or assist necessary to complete eval    OT Frequency  --   eval only   OT Treatment/Interventions  Self-care/ADL training;Patient/family education    Plan  no further occupational therapy needed at this time    Consulted and Agree with Plan of Care  Patient       Patient will benefit from skilled therapeutic intervention in order to improve the following deficits and impairments:   Body Structure / Function / Physical Skills: Endurance       Visit Diagnosis: Muscle weakness (generalized)    Problem List Patient Active Problem List   Diagnosis Date Noted  . Acute CVA (cerebrovascular accident) ([facility]) 05/08/2019  . CVA (cerebral vascular accident) ([facility]) 05/07/2019  . Stroke (cerebrum) ([facility]) 05/07/2019  . CKD (chronic kidney disease) stage 3, GFR 30-59 ml/min   . Hypertriglyceridemia   . Smoker   . Hyperlipidemia 06/30/2012  . Colon polyp, hyperplastic 06/30/2012  [facility] 05/17/2019, 11:37 AM  [facility] [address] [place], [place], [address] Phone: [phone]   Fax:  [phone]  Name: Jeffy Korch MRN: [id number] Date of Birth: [birth year]   Vianne Bulls, OTR/L [facility] [address] [place], [place]  [address] [phone] phone [phone] 05/17/19 11:37 AM

## 2019-05-17 NOTE — Therapy (Signed)
[facility] [address] [place], [place], [address] Phone: [phone]   Fax:  [phone]  Physical Therapy Evaluation  Patient Details  Name: Christian [name] MRN: [id number] Date of Birth: 11/28/1945 No data recorded  Encounter Date: 05/17/2019  PT End of Session - 05/17/19 1344    Visit Number  1    Number of Visits  1    PT Start Time  1015    PT Stop Time  1100    PT Time Calculation (min)  45 min    Equipment Utilized During Treatment  Gait belt    Activity Tolerance  Patient tolerated treatment well    Behavior During Therapy  WFL for tasks assessed/performed       Past Medical History:  Diagnosis Date  . Adenomatous colon polyp   . Allergy    seasonal  . CKD (chronic kidney disease) stage 3, GFR 30-59 ml/min   . Hyperlipidemia   . Hypertriglyceridemia   . PVD (peripheral vascular disease) (HCC)    decreased TBI bilaterally  . Smoker     Past Surgical History:  Procedure Laterality Date  . CHOLECYSTECTOMY  1987  . EYE SURGERY     lasik  . LOOP RECORDER INSERTION N/A 05/09/2019   Procedure: LOOP RECORDER INSERTION;  Surgeon: Evans Lance, MD;  Location: [facility];  Service: Cardiovascular;  Laterality: N/A;    There were no vitals filed for this visit.   Subjective Assessment - 05/17/19 1336    Subjective  Went to ED on 05/07/19 as he was having stroke symptoms or left sided facial droop and left sided weakness. Since the discharge from hospital, he is walking around home. He is going up and down stairs. He still has headache on the right side at base of headache.    Pertinent History  hyperlipidemia, hx of CVA, PVD    How long can you sit comfortably?  no issues    How long can you stand comfortably?  no issues    How long can you walk comfortably?  20 min    Diagnostic tests  IMPRESSION:Acute infarct in the right cerebellar tonsil, right cerebellum, andleft pons. Small acute infarct in  the left parietal lobe. Occlusion distal right vertebral artery which appears acute on CTA.    Patient Stated Goals  get stronger    Currently in Pain?  No/denies         [facility] PT Assessment - 05/17/19 1031      Standardized Balance Assessment   Standardized Balance Assessment  Berg Balance Test;Dynamic Gait Index;Five Times Sit to Stand;10 meter walk test    Five times sit to stand comments   16 seconds    10 Meter Walk  1.34      Berg Balance Test   Sit to Stand  Able to stand without using hands and stabilize independently    Standing Unsupported  Able to stand safely 2 minutes    Sitting with Back Unsupported but Feet Supported on Floor or Stool  Able to sit safely and securely 2 minutes    Stand to Sit  Sits safely with minimal use of hands    Transfers  Able to transfer safely, minor use of hands    Standing Unsupported with Eyes Closed  Able to stand 10 seconds safely    Standing Unsupported with Feet Together  Able to place feet together independently and stand 1 minute safely    From Standing,  Reach Forward with Outstretched Arm  Can reach confidently >25 cm (10")    From Standing Position, Pick up Object from [place] to pick up shoe safely and easily    From Standing Position, Turn to Look Behind Over each Shoulder  Looks behind from both sides and weight shifts well    Turn 360 Degrees  Able to turn 360 degrees safely in 4 seconds or less    Standing Unsupported, Alternately Place Feet on Step/Stool  Able to stand independently and safely and complete 8 steps in 20 seconds    Standing Unsupported, One Foot in Front  Able to plae foot ahead of the other independently and hold 30 seconds    Standing on One Leg  Able to lift leg independently and hold > 10 seconds    Total Score  55      Dynamic Gait Index   Level Surface  Normal    Change in Gait Speed  Normal    Gait with Horizontal Head Turns  Normal    Gait with Vertical Head Turns  Normal    Gait and Pivot Turn   Normal    Step Over Obstacle  Normal    Step Around Obstacles  Normal    Steps  Normal    Total Score  24                Objective measurements completed on examination: See above findings.              PT Education - 05/17/19 1339    Education Details  Pt educated on evaluation findings and POC. patient educated on gradual walking program of up to 30 min a day. patient educated on not overly exerting himself with daily functioning.    Person(s) Educated  Patient;Spouse    Methods  Explanation    Comprehension  Verbalized understanding          PT Long Term Goals - 05/17/19 1340      PT LONG TERM GOAL #1   Title  Pt will be demo understanding of POC and HEP to manage functional indepdnence.    Time  1    Period  Days    Status  Achieved    Target Date  05/17/19             Plan - 05/17/19 1058    Clinical Impression Statement  Patient is a 74 y.o. male who was seen today for physicla therapy evaluation and treatment for gait and mobility disorder after CVA. Patient currently demonstrates no fall risk. He scored 55/56 on Berg balance scale and 24/24 on Dynamic gait index. Patient demonstrated gait speed of 1.56m/s which is indicative of community ambulator. Patient is functioning at independent level and does not need skilled PT services at this time. He will be given home exercise program to continue to improve his balance and walking endurance.    Personal Factors and Comorbidities  Time since onset of injury/illness/exacerbation    Stability/Clinical Decision Making  Stable/Uncomplicated    Clinical Decision Making  Low    Rehab Potential  Excellent    PT Frequency  1x / week    PT Duration  --   Evaluation only   PT Treatment/Interventions  Patient/family education    PT Next Visit Plan  m/a    PT Home Exercise Plan  Access Code: [email or web address]: [email or web address]: 05/11/2021Prepared by: Gwenyth Bouillon PatelExercisesTandem Walking - 1  x daily - 5 x weekly -  1 sets - 10 reps    Consulted and Agree with Plan of Care  Patient;Family member/caregiver    Family Member Consulted  wife       Patient will benefit from skilled therapeutic intervention in order to improve the following deficits and impairments:  Decreased balance  Visit Diagnosis: Other abnormalities of gait and mobility  Unsteadiness on feet  Hemiplegia and hemiparesis following cerebral infarction affecting left non-dominant side [facility])     Problem List Patient Active Problem List   Diagnosis Date Noted  . Acute CVA (cerebrovascular accident) ([facility]) 05/08/2019  . CVA (cerebral vascular accident) ([place] [facility]) 05/07/2019  . Stroke (cerebrum) ([facility] [place]) 05/07/2019  . CKD (chronic kidney disease) stage 3, GFR 30-59 ml/min   . Hypertriglyceridemia   . Smoker   . Hyperlipidemia 06/30/2012  . Colon polyp, hyperplastic 06/30/2012    Kerrie Pleasure, PT 05/17/2019, 1:45 PM  [facility] [address] [place], [place], [address] Phone: [phone]   Fax:  [phone]  Name: Christian [name] MRN: [id number] Date of Birth: 1945/08/14

## 2019-05-18 ENCOUNTER — Other Ambulatory Visit: Payer: Self-pay | Admitting: Family Medicine

## 2019-05-18 DIAGNOSIS — R7989 Other specified abnormal findings of blood chemistry: Secondary | ICD-10-CM

## 2019-05-19 ENCOUNTER — Ambulatory Visit (INDEPENDENT_AMBULATORY_CARE_PROVIDER_SITE_OTHER): Payer: Medicare Other | Admitting: Emergency Medicine

## 2019-05-19 ENCOUNTER — Other Ambulatory Visit: Payer: Self-pay

## 2019-05-19 DIAGNOSIS — Z95818 Presence of other cardiac implants and grafts: Secondary | ICD-10-CM

## 2019-05-19 DIAGNOSIS — I639 Cerebral infarction, unspecified: Secondary | ICD-10-CM

## 2019-05-19 LAB — CUP PACEART INCLINIC DEVICE CHECK
Date Time Interrogation Session: 20210513165302
Implantable Pulse Generator Implant Date: 20210503

## 2019-05-19 NOTE — Progress Notes (Signed)
ILR wound check in clinic. Steri strips removed prior to visit by patient. Per patient, scab "opened back up" after shower this morning and incision started oozing. Scant sanguineous drainage noted on bandaid. No signs/symptoms of infection noted. Steri-strips reapplied. Patient instructed to remove steri-strips in one week and send a photo to DC via [place]. Educated patient about Carelink monitor and wound care, including signs/symptoms of infection. Home monitor transmitting nightly. No episodes. Monthly summary reports and ROV with Dr. Lovena Le PRN.

## 2019-05-19 NOTE — Patient Instructions (Signed)
Do not come to the office for monthly "remote pacer check appointments."  These are automatically sent to [place] through your app.  Call the [facility] at [phone] with any questions.  In about one week, remove the steri-strips from your incision.  Please send a photo through [place] so that we can make sure your incision is healed.  Call in the meantime if you notice any drainage, redness, swelling, fever, or chills.

## 2019-05-20 ENCOUNTER — Telehealth: Payer: Self-pay | Admitting: Internal Medicine

## 2019-05-20 ENCOUNTER — Encounter: Payer: Self-pay | Admitting: *Deleted

## 2019-05-20 ENCOUNTER — Ambulatory Visit
Admission: RE | Admit: 2019-05-20 | Discharge: 2019-05-20 | Disposition: A | Discharge status: Home / Self Care | Payer: Medicare Other | Source: Ambulatory Visit | Attending: Family Medicine | Admitting: Family Medicine

## 2019-05-20 ENCOUNTER — Other Ambulatory Visit: Payer: Self-pay | Admitting: *Deleted

## 2019-05-20 DIAGNOSIS — R7989 Other specified abnormal findings of blood chemistry: Secondary | ICD-10-CM

## 2019-05-20 DIAGNOSIS — K7689 Other specified diseases of liver: Secondary | ICD-10-CM | POA: Diagnosis not present

## 2019-05-20 NOTE — Telephone Encounter (Signed)
Spoke with patient. Steri-strips re-applied on 5/13 at wound check visit. Pt reports steri-strips remain in place but he noticed some oozing through the strips overnight. No further drainage noted this morning. Advised pt to reinforce steri-strips with gauze/tape dressing, hold pressure for 20 min if any further oozing noted. Encouraged pt to keep site dry and covered for at least the next 2 days, keep steri-strips in place until next Wednesday as originally planned. Reviewed signs/symptoms of infection. Direct DC number provided and advised pt to call main office number for on-call provider if any concerns over the weekend. Pt verbalizes understanding and agreement with plan. Denies further questions at this time.

## 2019-05-20 NOTE — Patient Outreach (Signed)
[facility]) Care Management  05/20/2019  Christian [name] Jan 31, 1945 [id number]   Subjective: Telephone call to patient's home number, spoke with patient, and HIPAA verified.  Discussed [place] Medicare EMMI Stroke Red Flag Alert follow up, patient voiced understanding, and is in agreement to follow up.  Patient states he remembers receiving EMMI automated calls.  Patient states he is doing well, was outside cutting down some brushes earlier today, walking 20 -30 minutes per day without difficulty, increasing endurance / stamina daily, and will have a follow up appointment with neurologist on 06/16/2019.   States he had outpatient physical and occupational therapy evaluations, he was told he is progressing well, no additional therapy needed.  States he is continuing to working on smoking cessation, aware of available resources, and has decreased smoking to 2 cigarettes per day.  Patient states he is able to manage self care and has assistance as needed.   Patient voices understanding of medical diagnosis and treatment plan. States she is accessing his Medicare benefits as needed via member services number on back of card.  Discussed Advanced Directives, advised of [employer] document scanning  system via local providers office or hospitals, patient voices understanding, and he will discuss accessing through patient's primary provider if needed in the future.  Patient states he does not have any education material, EMMI follow up, care coordination, care management, disease monitoring, transportation, community resource, or pharmacy needs at this time. States he is very appreciative of the follow up, is in agreement  to receive 1 additional follow up call to assess for further CM needs, and is in agreement to receive [place] Management EMMI follow up calls as needed.     Objective: Per KPN (Knowledge Performance Now, point of care tool) and chart review, patient  hospitalized 05/07/2019 - 05/09/2019 for Multiple bilateral embolic type CVAs.  Patient also has a history of hypertension, HLD, chronic kidney stage 2, and tobacco abuse.      Assessment: Received [employer] EMMI Stroke Google Alert referral on 05/18/2019.   Red Flag Alert Trigger, Day #6, patient answered yes to the following: Smoked or been around smoke?  EMMI follow up completed and will follow up to assess further care management needs.      Plan: RNCM will call patient for telephone outreach attempt, within 14 business days, EMMI follow up, to assess for further CM needs, and proceed with case closure, within 10 business days if no return call.       Darlene H. Annia Friendly, BSN, [place] Management [facility] Telephonic CM Phone: [phone] Fax: [phone]

## 2019-05-20 NOTE — Telephone Encounter (Signed)
   Pt said he went to have his strips replaced to his heart monitor. He said over night the blood seeps through the bandage and doesn't know what to do.  Please advise

## 2019-05-25 ENCOUNTER — Telehealth: Payer: Self-pay | Admitting: *Deleted

## 2019-05-25 ENCOUNTER — Encounter: Payer: Self-pay | Admitting: Family Medicine

## 2019-05-25 NOTE — Telephone Encounter (Signed)
Spoke with patient to f/u on ILR incision. Pt denies any additional oozing since discussion on 05/20/19. Pt removed steri-strips while on the phone, states "it looks pretty good." Pt agrees to send photo for review. Denies additional questions or concerns at this time.

## 2019-05-25 NOTE — Telephone Encounter (Signed)
Spoke with patient to advise that photo has not been received. Pt agrees to send via [place]. Assisted with sending photo. Advised incision edges appear fully approximated and healing well. Wound care instructions reviewed, encouraged pt to continue monitoring for any signs/symptoms of infection or bleeding. Pt verbalizes understanding and denies questions or concerns at this time.

## 2019-05-25 NOTE — Telephone Encounter (Signed)
ILR incision photo sent in to accompany phone note from 05/25/19. Encounter closed.

## 2019-06-07 ENCOUNTER — Other Ambulatory Visit: Payer: Self-pay | Admitting: *Deleted

## 2019-06-07 NOTE — Patient Outreach (Signed)
[facility]) Care Management  06/07/2019  Christian [name] 1945/11/06 [id number]   Subjective: Telephone call to patient's home  number, no answer, left HIPAA compliant voicemail message, and requested call back.    Objective: Per KPN (Knowledge Performance Now, point of care tool) and chart review, patient hospitalized 05/07/2019 - 05/09/2019 for Multiple bilateral embolic type CVAs.  Patient also has a history of hypertension, HLD, chronic kidney stage 2, and tobacco abuse.      Assessment: Received [employer] EMMI Stroke Google Alert referral on 05/18/2019.   Red Flag Alert Trigger, Day #6, patient answered yes to the following: Smoked or been around smoke? EMMI follow up completed and will follow up to assess further care management needs.      Plan: RNCM will call patient for 2nd  telephone outreach attempt, within 4 business days, EMMI follow up, to assess for further CM needs, and proceed with case closure, within 10 business days if no return call.      Christian [name] H. Annia Friendly, BSN, [place] Management [facility] Telephonic CM Phone: [phone] Fax: [phone]

## 2019-06-09 ENCOUNTER — Ambulatory Visit (INDEPENDENT_AMBULATORY_CARE_PROVIDER_SITE_OTHER): Payer: Medicare Other | Admitting: *Deleted

## 2019-06-09 DIAGNOSIS — I639 Cerebral infarction, unspecified: Secondary | ICD-10-CM | POA: Diagnosis not present

## 2019-06-10 LAB — CUP PACEART REMOTE DEVICE CHECK
Date Time Interrogation Session: 20210603173251
Implantable Pulse Generator Implant Date: 20210503

## 2019-06-13 ENCOUNTER — Other Ambulatory Visit: Payer: Self-pay | Admitting: *Deleted

## 2019-06-13 NOTE — Patient Outreach (Signed)
[facility]) Care Management  06/13/2019  Nihar Klus [birth year] [id number]   Subjective: Telephone call to patient's home  number, no answer, left HIPAA compliant voicemail message, and requested call back.    Objective:Per KPN (Knowledge Performance Now, point of care tool) and chart review,patient hospitalized 05/07/2019 - 05/09/2019 forMultiple bilateral embolic type CVAs. Patient also has a history of hypertension, HLD, chronic kidney stage 2, and tobacco abuse.      Assessment: Received [employer] EMMI Stroke Google Alert referral on 05/18/2019. Red Flag Alert Trigger, Day #6, patient answered yes to the following: Smoked or been around smoke? EMMI follow up completed and will follow up to assess further care management needs.      Plan:RNCM will send unsuccessful outreach letter, [facility] pamphlet, handout: Know Before You Go, will call patient for 3rd  telephone outreach attempt, within4business days, EMMI follow up, to assess for further CM needs, and proceed with case closure, within 10 business days if no return call, after 3rd unsuccessful outreach call.      Fabricio Endsley H. Annia Friendly, BSN, [place] Management [facility] Telephonic CM Phone: [phone] Fax: [phone]

## 2019-06-14 NOTE — Progress Notes (Signed)
Carelink Summary Report / Loop Recorder 

## 2019-06-16 ENCOUNTER — Ambulatory Visit: Payer: Medicare Other | Admitting: Adult Health

## 2019-06-16 ENCOUNTER — Other Ambulatory Visit: Payer: Self-pay | Admitting: *Deleted

## 2019-06-16 ENCOUNTER — Encounter: Payer: Self-pay | Admitting: Adult Health

## 2019-06-16 ENCOUNTER — Other Ambulatory Visit: Payer: Self-pay

## 2019-06-16 VITALS — BP 128/74 | HR 62 | Ht 68.0 in | Wt 171.0 lb

## 2019-06-16 DIAGNOSIS — R2689 Other abnormalities of gait and mobility: Secondary | ICD-10-CM | POA: Diagnosis not present

## 2019-06-16 DIAGNOSIS — I69398 Other sequelae of cerebral infarction: Secondary | ICD-10-CM | POA: Diagnosis not present

## 2019-06-16 DIAGNOSIS — I639 Cerebral infarction, unspecified: Secondary | ICD-10-CM | POA: Diagnosis not present

## 2019-06-16 DIAGNOSIS — E7800 Pure hypercholesterolemia, unspecified: Secondary | ICD-10-CM

## 2019-06-16 NOTE — Progress Notes (Signed)
[facility] [address] [place]. [place] [address] [phone]       [name] FOLLOW UP NOTE  [name] Date of Birth:  03/11/1945 Medical Record Number:  [id number]   Reason for Referral:  [name] stroke follow up    SUBJECTIVE:   CHIEF COMPLAINT:  Chief Complaint  Patient presents with  . Follow-up    tx rm here for a f/u on a stroke. Pt is not hving any new sx.    HPI:   [name] is a 74 y.o. male with history of [place] HLD, ASPVD, CKD 3, tobacco abuse who presented on 05/07/2018 to [facility] ED as a code stroke for dizziness, dysarthria, diplopia and left facial droop.   Stroke work-up revealed multiple bilateral strokes (right cerebellar tonsil, right cerebellum and left pons as well as left parietal lobe) embolic pattern concerning for cardioembolic source therefore loop recorder placed to evaluate for atrial fibrillation as potential etiology.  CTA showed occlusion of distal R VA at C1 level through the V4 level..  Recommended DAPT for 3 months and then Plavix alone given right VA occlusion.  No prior history of HTN BP stable during mission.  LDL 62 and continued atorvastatin 40 mg daily.  Other stroke risk factors include current tobacco use, EtOH use and family history of stroke but no personal history of stroke.  Evaluated by therapy who recommended outpatient PT/OT therapy and discharged home in stable condition.  Stroke:  Multiple bilateral strokes - embolic pattern - concerning for cardioembolic source  CT Head - No acute abnormality. Atrophy and chronic microvascular ischemia. ASPECTS is 10  CTA H&N - Occlusion of the distal right vertebral artery at the C1 level through the V4 level. This has ill-defined margins and likely is acute either due dissection or less likely atherosclerotic disease.   MRI head - Acute infarct in the right cerebellar tonsil, right cerebellum, and left pons. Small acute infarct in the left parietal lobe. Occlusion  distal right vertebral artery which appears acute on CTA.   2D Echo EF 65-70%  LE venous Doppler no DVT  For loop recorder placement 05/09/2019  Lacey Jensen Virus 2 - negative  LDL - 62  HgbA1c - 5.9  VTE prophylaxis - [place] Heparin / SCDs  aspirin 81 mg daily prior to admission, now on aspirin 325 mg daily and clopidogrel 75 mg daily.  Continue DAPT for 3 months given right VA occlusion and then Plavix alone.  Therapy recommendations: Home health OT, OP PT, no SLP -> OP therapy follow up arranged by [name]  Disposition:  Return home  Today, 06/16/2019, [name] is being seen for [name] follow-up accompanied by his wife.  He has been doing well since discharge with intermittent dizziness usually with overexertion but endorses ongoing improvement.  Denies residual speech or visual impairment.  He does report mild right posterior headache which has been present since his stroke with ongoing improvement.  Will occasionally take Tylenol with benefit. Continues on DAPT with aspirin 325 mg daily and clopidogrel 75 mg daily without bleeding or bruising.  Continues on atorvastatin without myalgias.  Blood pressure today 128/74.  Loop recorder has not shown atrial fibrillation thus far.  No concerns at this time.     ROS:   14 system review of systems performed and negative with exception of dizziness and headache  PMH:  Past Medical History:  Diagnosis Date  . Adenomatous colon polyp   . Allergy    seasonal  .  CKD (chronic kidney disease) stage 3, GFR 30-59 ml/min   . Hyperlipidemia   . Hypertriglyceridemia   . PVD (peripheral vascular disease) (HCC)    decreased TBI bilaterally  . Smoker     PSH:  Past Surgical History:  Procedure Laterality Date  . CHOLECYSTECTOMY  1987  . EYE SURGERY     lasik  . LOOP RECORDER INSERTION N/A 05/09/2019   Procedure: LOOP RECORDER INSERTION;  Surgeon: Evans Lance, MD;  Location: [facility];  Service: Cardiovascular;  Laterality: N/A;     Social History:  Social History   Socioeconomic History  . Marital status: Married    Spouse name: Not on file  . Number of children: Not on file  . Years of education: Not on file  . Highest education level: Not on file  Occupational History  . Not on file  Tobacco Use  . Smoking status: Current Every Day Smoker    Packs/day: 0.25    Types: Cigarettes  . Smokeless tobacco: Never Used  Vaping Use  . Vaping Use: Never used  Substance and Sexual Activity  . Alcohol use: Yes    Alcohol/week: 14.0 standard drinks    Types: 7 Shots of liquor, 7 Standard drinks or equivalent per week    Comment: 1 drink per day per pt  . Drug use: No  . Sexual activity: Not on file  Other Topics Concern  . Not on file  Social History Narrative  . Not on file   Social Determinants of Health   Financial Resource Strain:   . Difficulty of Paying Living Expenses:   Food Insecurity:   . Worried About Charity fundraiser in the Last Year:   . Arboriculturist in the Last Year:   Transportation Needs: No Transportation Needs  . Lack of Transportation (Medical): No  . Lack of Transportation (Non-Medical): No  Physical Activity:   . Days of Exercise per Week:   . Minutes of Exercise per Session:   Stress:   . Feeling of Stress :   Social Connections:   . Frequency of Communication with Friends and Family:   . Frequency of Social Gatherings with Friends and Family:   . Attends Religious Services:   . Active Member of Clubs or Organizations:   . Attends Archivist Meetings:   Marland Kitchen Marital Status:   Intimate Partner Violence:   . Fear of Current or Ex-Partner:   . Emotionally Abused:   Marland Kitchen Physically Abused:   . Sexually Abused:     Family History:  Family History  Problem Relation Age of Onset  . Hypertension Mother   . Stroke Mother   . Diabetes Mother        borderline  . Arthritis Father   . ALS Son   . Colon cancer Neg Hx   . Esophageal cancer Neg Hx   . Rectal  cancer Neg Hx   . Stomach cancer Neg Hx     Medications:   Current Outpatient Medications on File Prior to Visit  Medication Sig Dispense Refill  . acetaminophen (TYLENOL) 325 MG tablet Take 2 tablets (650 mg total) by mouth every 4 (four) hours as needed for mild pain (or temp > 37.5 C (99.5 F)).    Marland Kitchen aspirin EC 325 MG EC tablet Take 1 tablet (325 mg total) by mouth daily. 30 tablet 0  . atorvastatin (LIPITOR) 40 MG tablet TAKE 1 TABLET BY MOUTH EVERY DAY (Patient taking  differently: Take 40 mg by mouth daily. ) 90 tablet 3  . cholecalciferol (VITAMIN D) 1000 UNITS tablet Take 1,000 Units by mouth daily.    . clopidogrel (PLAVIX) 75 MG tablet Take 1 tablet (75 mg total) by mouth daily. 90 tablet 3  . diazepam (VALIUM) 5 MG tablet Take 1 tablet (5 mg total) by mouth every 12 (twelve) hours as needed for anxiety. 30 tablet 1  . fluticasone (FLONASE) 50 MCG/ACT nasal spray PLACE 2 SPRAYS INTO EACH NOSTRIL DAILY (Patient taking differently: Place 2 sprays into both nostrils daily. ) 48 mL 2  . loratadine (CLARITIN) 10 MG tablet Take 10 mg by mouth daily.    . Multiple Vitamins-Minerals (MULTIVITAMIN WITH MINERALS) tablet Take 1 tablet by mouth daily.    . vitamin C (ASCORBIC ACID) 500 MG tablet Take 500 mg by mouth daily.     No current facility-administered medications on file prior to visit.    Allergies:  No Known Allergies    OBJECTIVE:  Physical Exam  Vitals:   06/16/19 1357  BP: 128/74  Pulse: 62  Weight: 171 lb (77.6 kg)  Height: 5\' 8"  (1.727 m)   Body mass index is 26 kg/m. No exam data present  Depression screen Christian [name] 2/9 06/16/2019  Decreased Interest 0  Down, Depressed, Hopeless 0  PHQ - 2 Score 0     General: well developed, well nourished,  pleasant elderly Caucasian male, seated, in no evident distress Head: head normocephalic and atraumatic.   Neck: supple with no carotid or supraclavicular bruits Cardiovascular: regular rate and rhythm, no  murmurs Musculoskeletal: no deformity Skin:  no rash/petichiae Vascular:  Normal pulses all extremities   Neurologic Exam Mental Status: Awake and fully alert.   Fluent speech and language.  Oriented to place and time. Recent and remote memory intact. Attention span, concentration and fund of knowledge appropriate. Mood and affect appropriate.  Cranial Nerves: Fundoscopic exam reveals sharp disc margins. Pupils equal, briskly reactive to light. Extraocular movements full without nystagmus. Visual fields full to confrontation. Hearing intact. Facial sensation intact. Face, tongue, palate moves normally and symmetrically.  Motor: Normal bulk and tone. Normal strength in all tested extremity muscles. Sensory.: intact to touch , pinprick , position and vibratory sensation.  Coordination: Rapid alternating movements normal in all extremities. Finger-to-nose and heel-to-shin performed accurately bilaterally. Gait and Station: Arises from chair without difficulty. Stance is normal. Gait demonstrates normal stride length and balance.  Able to heel toe and stand on single leg alone with only mild difficulty.  Romberg negative. Reflexes: 1+ and symmetric. Toes downgoing.     NIHSS  0 Modified Rankin  1      ASSESSMENT: Christian [name] is a 74 y.o. year old male presented with dizziness, dysarthria, diplopia and left facial droop on 05/07/2019 with stroke work-up revealing multiple bilateral strokes (right cerebellar tonsil, right cerebellum and left pons as well as left parietal lobe) with right VA occlusion, embolic pattern concerning for cardioembolic source.  Loop recorder placed to evaluate for atrial fibrillation as possible etiology.  Vascular risk factors include HLD, tobacco use and age.  Recovered well from a stroke standpoint with only residual intermittent imbalance and mild right posterior headache with ongoing improvement     PLAN:  1. Multiple bilateral strokes:  -Mild imbalance and  right posterior headache likely caused stroke with ongoing improvement.  Declines interest in headache management at this time has relatively stable with improvement and not debilitating.  Advised to call office  if headaches worsen or do not improve as well as continued HEP as recommended during therapy for ongoing improvement of balance -Continue aspirin 325 mg daily and clopidogrel 75 mg daily  and atorvastatin 40 mg daily for secondary stroke prevention.  Complete 3 months DAPT and then Plavix alone (around 08/2019).   -Loop recorder will continue to be monitored for possible atrial fibrillation.   -Maintain strict control of hypertension with blood pressure goal below 130/90, diabetes with hemoglobin A1c goal below 6.5% and cholesterol with LDL cholesterol (bad cholesterol) goal below 70 mg/dL.  I also advised the patient to eat a healthy diet with plenty of whole grains, cereals, fruits and vegetables, exercise regularly with at least 30 minutes of continuous activity daily and maintain ideal body weight. 2. HLD:-Advised to continue current treatment regimen along with continued follow-up with PCP for future prescribing and monitoring of lipid panel 3. Provided patient and wife with information regarding Christian [name] trial in which they will discuss further at home and review provided information and will call [place] [employer] with any questions or interested in participation    Follow up in 4 months or call earlier if needed   I spent 45 minutes of face-to-face and non-face-to-face time with patient.  This included previsit chart review, lab review, study review, order entry, electronic health record documentation, patient education regarding recent stroke, residual deficits, importance of managing stroke risk factors and answered all questions to patient satisfaction     Frann Rider, [facility]  [facility] [address] [place], [place] [address]  Phone  [phone] Fax [phone] Note: This document was prepared with digital dictation and possible smart phrase technology. Any transcriptional errors that result from this process are unintentional.

## 2019-06-16 NOTE — Patient Instructions (Signed)
Continue aspirin 325 mg daily and clopidogrel 75 mg daily  and atorvastatin for secondary stroke prevention  Continue aspirin for additional 2 months and then discontinue  Continue to follow up with PCP regarding cholesterol and blood pressure management   Loop recorder will continue to be monitored to evaluate atrial fibrillation as possible stroke etiology  Maintain strict control of hypertension with blood pressure goal below 130/90, diabetes with hemoglobin A1c goal below 6.5% and cholesterol with LDL cholesterol (bad cholesterol) goal below 70 mg/dL. I also advised the patient to eat a healthy diet with plenty of whole grains, cereals, fruits and vegetables, exercise regularly and maintain ideal body weight.  Followup in the future with me in 4 months or call earlier if needed       Thank you for coming to see [place] at [facility]. I hope we have been able to provide you high quality care today.  You may receive a patient satisfaction survey over the next few weeks. We would appreciate your feedback and comments so that we may continue to improve ourselves and the health of our patients.

## 2019-06-16 NOTE — Patient Outreach (Addendum)
[facility]) Care Management  06/16/2019  Christian [name] 1945-09-10 [id number]   Subjective:Telephone call to patient's home number, no answer, left HIPAA compliant voicemail message, and requested call back.    Objective:Per KPN (Knowledge Performance Now, point of care tool) and chart review,patient hospitalized 05/07/2019 - 05/09/2019 forMultiple bilateral embolic type CVAs. Patient also has a history of hypertension, HLD, chronic kidney stage 2, and tobacco abuse.      Assessment: Received [employer] EMMI Stroke Google Alert referral on 05/18/2019. Red Flag Alert Trigger, Day #6, patient answered yes to the following: Smoked or been around smoke? EMMI follow up completed and will follow up to assess further care management needs.      Plan:RNCM has sent unsuccessful outreach letter, [facility] pamphlet, handout: Know Before You Go, will call patient for4thtelephone outreach attempt, within21 business days, EMMI follow up, to assess for further CM needs, and proceed with case closure, after 4th unsuccessful outreach call.      Christian [name], BSN, [place] Management [facility] Telephonic CM Phone: [phone] Fax: [phone]

## 2019-06-20 NOTE — Progress Notes (Signed)
I agree with the above plan 

## 2019-07-07 ENCOUNTER — Ambulatory Visit: Payer: Self-pay | Admitting: *Deleted

## 2019-07-08 ENCOUNTER — Ambulatory Visit: Payer: Self-pay | Admitting: *Deleted

## 2019-07-12 ENCOUNTER — Ambulatory Visit: Payer: Self-pay | Admitting: *Deleted

## 2019-07-12 ENCOUNTER — Ambulatory Visit (INDEPENDENT_AMBULATORY_CARE_PROVIDER_SITE_OTHER): Payer: Medicare Other | Admitting: *Deleted

## 2019-07-12 DIAGNOSIS — I6340 Cerebral infarction due to embolism of unspecified cerebral artery: Secondary | ICD-10-CM

## 2019-07-13 ENCOUNTER — Other Ambulatory Visit: Payer: Self-pay | Admitting: *Deleted

## 2019-07-13 LAB — CUP PACEART REMOTE DEVICE CHECK
Date Time Interrogation Session: 20210706173428
Implantable Pulse Generator Implant Date: 20210503

## 2019-07-13 NOTE — Patient Outreach (Signed)
[facility]) Care Management  07/13/2019  Christian [name] 10-30-1945 [id number]   Subjective: Telephone call to patient's home number, spoke with patient, and HIPAA verified.  Patient states he is doing ok, recovering well, had 06/16/2019 follow up appointment with neurologist, and appointment went well.   States the headaches have finally resolved and no pain issues.   States he had a remote check in with Loop recorder on 07/12/2019 and has not received the results to date.  States his next appointment with be on 08/11/2019 with primary provider for annual physical.   Patient states he does not have any education material, EMMI follow up, care coordination, care management, disease monitoring, transportation, community resource, or pharmacy needs at this time.  States he is aware that EMMI automated follow up calls are completed and no further care management needs at this time, will contact [place] Management if services needed in the future.  States he is very appreciative of the follow up.     Objective:Per KPN (Knowledge Performance Now, point of care tool) and chart review,patient hospitalized 05/07/2019 - 05/09/2019 forMultiple bilateral embolic type CVAs. Patient also has a history of hypertension, HLD, chronic kidney stage 2, and tobacco abuse.      Assessment: Received [employer] EMMI Stroke Google Alert referral on 05/18/2019. Red Flag Alert Trigger, Day #6, patient answered yes to the following: Smoked or been around smoke? EMMI follow up completed and no further care management needs.       Plan:RNCM will complete case closure due to follow up completed / no care management needs.  RNCM will send MD case closure letter.         Christian [name] H. Annia Friendly, BSN, [place] Management [facility] Telephonic CM Phone: [phone] Fax: [phone]

## 2019-07-13 NOTE — Progress Notes (Signed)
Carelink Summary Report / Loop Recorder 

## 2019-08-08 ENCOUNTER — Other Ambulatory Visit: Payer: Self-pay | Admitting: Family Medicine

## 2019-08-09 ENCOUNTER — Other Ambulatory Visit: Payer: Medicare Other

## 2019-08-09 ENCOUNTER — Other Ambulatory Visit: Payer: Self-pay

## 2019-08-09 DIAGNOSIS — R7989 Other specified abnormal findings of blood chemistry: Secondary | ICD-10-CM | POA: Diagnosis not present

## 2019-08-09 DIAGNOSIS — N183 Chronic kidney disease, stage 3 unspecified: Secondary | ICD-10-CM | POA: Diagnosis not present

## 2019-08-09 DIAGNOSIS — E7800 Pure hypercholesterolemia, unspecified: Secondary | ICD-10-CM | POA: Diagnosis not present

## 2019-08-09 DIAGNOSIS — Z0000 Encounter for general adult medical examination without abnormal findings: Secondary | ICD-10-CM

## 2019-08-11 ENCOUNTER — Ambulatory Visit (INDEPENDENT_AMBULATORY_CARE_PROVIDER_SITE_OTHER): Payer: Medicare Other | Admitting: Family Medicine

## 2019-08-11 ENCOUNTER — Other Ambulatory Visit: Payer: Self-pay

## 2019-08-11 VITALS — BP 102/60 | HR 78 | Temp 97.4°F | Ht 68.0 in | Wt 171.0 lb

## 2019-08-11 DIAGNOSIS — I6930 Unspecified sequelae of cerebral infarction: Secondary | ICD-10-CM

## 2019-08-11 DIAGNOSIS — I639 Cerebral infarction, unspecified: Secondary | ICD-10-CM

## 2019-08-11 DIAGNOSIS — Z0001 Encounter for general adult medical examination with abnormal findings: Secondary | ICD-10-CM | POA: Diagnosis not present

## 2019-08-11 DIAGNOSIS — N183 Chronic kidney disease, stage 3 unspecified: Secondary | ICD-10-CM

## 2019-08-11 DIAGNOSIS — E781 Pure hyperglyceridemia: Secondary | ICD-10-CM

## 2019-08-11 DIAGNOSIS — Z720 Tobacco use: Secondary | ICD-10-CM | POA: Diagnosis not present

## 2019-08-11 DIAGNOSIS — E7800 Pure hypercholesterolemia, unspecified: Secondary | ICD-10-CM | POA: Diagnosis not present

## 2019-08-11 DIAGNOSIS — Z0000 Encounter for general adult medical examination without abnormal findings: Secondary | ICD-10-CM

## 2019-08-11 LAB — CBC WITH DIFFERENTIAL/PLATELET
Absolute Monocytes: 616 cells/uL (ref 200–950)
Basophils Absolute: 133 cells/uL (ref 0–200)
Basophils Relative: 1.9 %
Eosinophils Absolute: 371 cells/uL (ref 15–500)
Eosinophils Relative: 5.3 %
HCT: 45.5 % (ref 38.5–50.0)
Hemoglobin: 15.1 g/dL (ref 13.2–17.1)
Lymphs Abs: 2744 cells/uL (ref 850–3900)
MCH: 30 pg (ref 27.0–33.0)
MCHC: 33.2 g/dL (ref 32.0–36.0)
MCV: 90.5 fL (ref 80.0–100.0)
MPV: 10 fL (ref 7.5–12.5)
Monocytes Relative: 8.8 %
Neutro Abs: 3136 cells/uL (ref 1500–7800)
Neutrophils Relative %: 44.8 %
Platelets: 242 10*3/uL (ref 140–400)
RBC: 5.03 10*6/uL (ref 4.20–5.80)
RDW: 13 % (ref 11.0–15.0)
Total Lymphocyte: 39.2 %
WBC: 7 10*3/uL (ref 3.8–10.8)

## 2019-08-11 LAB — COMPLETE METABOLIC PANEL WITH GFR
AG Ratio: 1.8 (calc) (ref 1.0–2.5)
ALT: 25 U/L (ref 9–46)
AST: 20 U/L (ref 10–35)
Albumin: 4 g/dL (ref 3.6–5.1)
Alkaline phosphatase (APISO): 117 U/L (ref 35–144)
BUN/Creatinine Ratio: 13 (calc) (ref 6–22)
BUN: 16 mg/dL (ref 7–25)
CO2: 26 mmol/L (ref 20–32)
Calcium: 9.1 mg/dL (ref 8.6–10.3)
Chloride: 106 mmol/L (ref 98–110)
Creat: 1.26 mg/dL — ABNORMAL HIGH (ref 0.70–1.18)
GFR, Est African American: 64 mL/min/{1.73_m2} (ref >=60)
GFR, Est Non African American: 55 mL/min/{1.73_m2} — ABNORMAL LOW (ref >=60)
Globulin: 2.2 g/dL (calc) (ref 1.9–3.7)
Glucose, Bld: 107 mg/dL — ABNORMAL HIGH (ref 65–99)
Potassium: 4.6 mmol/L (ref 3.5–5.3)
Sodium: 140 mmol/L (ref 135–146)
Total Bilirubin: 0.7 mg/dL (ref 0.2–1.2)
Total Protein: 6.2 g/dL (ref 6.1–8.1)

## 2019-08-11 LAB — LIPID PANEL
Cholesterol: 156 mg/dL (ref <200)
HDL: 47 mg/dL (ref >=40)
LDL Cholesterol (Calc): 79 mg/dL (calc)
Non-HDL Cholesterol (Calc): 109 mg/dL (calc) (ref <130)
Total CHOL/HDL Ratio: 3.3 (calc) (ref <5.0)
Triglycerides: 206 mg/dL — ABNORMAL HIGH (ref <150)

## 2019-08-11 LAB — TEST AUTHORIZATION

## 2019-08-11 LAB — HEMOGLOBIN A1C
Hgb A1c MFr Bld: 5.7 % of total Hgb — ABNORMAL HIGH (ref <5.7)
Mean Plasma Glucose: 117 (calc)
eAG (mmol/L): 6.5 (calc)

## 2019-08-11 LAB — PSA: PSA: 0.3 ng/mL (ref <=4.0)

## 2019-08-11 NOTE — Progress Notes (Signed)
Subjective:     Patient ID: Christian [name], male   DOB: 13-Apr-1945, 74 y.o.   MRN: [id number]  HPI  Patient is a very pleasant 74 year old Caucasian male here today for complete physical exam.  Please see my last office visit.  At that time the patient had recently been discharged from the hospital after suffering a cryptogenic stroke.  He is completed 3 months of dual antiplatelet therapy with aspirin and Plavix.  Neurology recommended switching to Plavix alone after completing 3 months.  He is due for that at the present time.  He states that he is suffering very little residual sequela from the stroke.  He denies any falls.  He denies any depression.  He denies any memory loss.  Immunizations are up-to-date as witnessed below.  His last colonoscopy was in 2018.  At that time he had 1 tubular adenoma.  Therefore he will be due again in 2023.  He is also due for prostate cancer screening.  His last PSA was 0.3.  He is due for that again.  Otherwise he is doing well with no concerns.  He has had his Covid vaccination. Immunization History  Administered Date(s) Administered  . Influenza, High Dose Seasonal PF 10/20/2013, 11/01/2015, 09/29/2016, 10/15/2017  . Influenza-Unspecified 10/06/2012  . PFIZER SARS-COV-2 Vaccination 01/23/2019, 02/13/2019  . Pneumococcal Conjugate-13 07/01/2013  . Pneumococcal Polysaccharide-23 05/18/2009, 07/30/2015  . Tdap 01/07/2011  . Zoster 06/26/2011    Past Medical History:  Diagnosis Date  . Adenomatous colon polyp   . Allergy    seasonal  . CKD (chronic kidney disease) stage 3, GFR 30-59 ml/min   . Hyperlipidemia   . Hypertriglyceridemia   . PVD (peripheral vascular disease) (HCC)    decreased TBI bilaterally  . Smoker    Past Surgical History:  Procedure Laterality Date  . CHOLECYSTECTOMY  1987  . EYE SURGERY     lasik  . LOOP RECORDER INSERTION N/A 05/09/2019   Procedure: LOOP RECORDER INSERTION;  Surgeon: Evans Lance, MD;  Location: [facility];  Service: Cardiovascular;  Laterality: N/A;   Current Outpatient Medications on File Prior to Visit  Medication Sig Dispense Refill  . acetaminophen (TYLENOL) 325 MG tablet Take 2 tablets (650 mg total) by mouth every 4 (four) hours as needed for mild pain (or temp > 37.5 C (99.5 F)).    Marland Kitchen aspirin EC 325 MG EC tablet Take 1 tablet (325 mg total) by mouth daily. 30 tablet 0  . atorvastatin (LIPITOR) 40 MG tablet TAKE 1 TABLET BY MOUTH EVERY DAY 90 tablet 3  . cholecalciferol (VITAMIN D) 1000 UNITS tablet Take 1,000 Units by mouth daily.    . clopidogrel (PLAVIX) 75 MG tablet Take 1 tablet (75 mg total) by mouth daily. 90 tablet 3  . diazepam (VALIUM) 5 MG tablet Take 1 tablet (5 mg total) by mouth every 12 (twelve) hours as needed for anxiety. 30 tablet 1  . fluticasone (FLONASE) 50 MCG/ACT nasal spray PLACE 2 SPRAYS INTO EACH NOSTRIL DAILY (Patient taking differently: Place 2 sprays into both nostrils daily. ) 48 mL 2  . loratadine (CLARITIN) 10 MG tablet Take 10 mg by mouth daily.    . Multiple Vitamins-Minerals (MULTIVITAMIN WITH MINERALS) tablet Take 1 tablet by mouth daily.    . vitamin C (ASCORBIC ACID) 500 MG tablet Take 500 mg by mouth daily.     No current facility-administered medications on file prior to visit.   No Known Allergies Social History  Socioeconomic History  . Marital status: Married    Spouse name: Not on file  . Number of children: Not on file  . Years of education: Not on file  . Highest education level: Not on file  Occupational History  . Not on file  Tobacco Use  . Smoking status: Current Every Day Smoker    Packs/day: 0.25    Types: Cigarettes  . Smokeless tobacco: Never Used  Vaping Use  . Vaping Use: Never used  Substance and Sexual Activity  . Alcohol use: Yes    Alcohol/week: 14.0 standard drinks    Types: 7 Shots of liquor, 7 Standard drinks or equivalent per week    Comment: 1 drink per day per pt  . Drug use: No  . Sexual activity:  Not on file  Other Topics Concern  . Not on file  Social History Narrative  . Not on file   Social Determinants of Health   Financial Resource Strain:   . Difficulty of Paying Living Expenses:   Food Insecurity:   . Worried About Charity fundraiser in the Last Year:   . Arboriculturist in the Last Year:   Transportation Needs: No Transportation Needs  . Lack of Transportation (Medical): No  . Lack of Transportation (Non-Medical): No  Physical Activity:   . Days of Exercise per Week:   . Minutes of Exercise per Session:   Stress:   . Feeling of Stress :   Social Connections:   . Frequency of Communication with Friends and Family:   . Frequency of Social Gatherings with Friends and Family:   . Attends Religious Services:   . Active Member of Clubs or Organizations:   . Attends Archivist Meetings:   Marland Kitchen Marital Status:   Intimate Partner Violence:   . Fear of Current or Ex-Partner:   . Emotionally Abused:   Marland Kitchen Physically Abused:   . Sexually Abused:    Family History  Problem Relation Age of Onset  . Hypertension Mother   . Stroke Mother   . Diabetes Mother        borderline  . Arthritis Father   . ALS Son   . Colon cancer Neg Hx   . Esophageal cancer Neg Hx   . Rectal cancer Neg Hx   . Stomach cancer Neg Hx      Review of Systems  All other systems reviewed and are negative.      Objective:   Physical Exam Vitals reviewed.  Constitutional:      General: He is not in acute distress.    Appearance: He is well-developed. He is not diaphoretic.  HENT:     Head: Normocephalic and atraumatic.     Right Ear: External ear normal.     Left Ear: External ear normal.     Nose: Nose normal.     Mouth/Throat:     Pharynx: No oropharyngeal exudate.  Eyes:     General: No scleral icterus.       Right eye: No discharge.        Left eye: No discharge.     Conjunctiva/sclera: Conjunctivae normal.     Pupils: Pupils are equal, round, and reactive to light.   Neck:     Thyroid: No thyromegaly.     Vascular: No JVD.     Trachea: No tracheal deviation.  Cardiovascular:     Rate and Rhythm: Normal rate and regular rhythm.     Heart  sounds: Normal heart sounds. No murmur heard.  No friction rub. No gallop.   Pulmonary:     Effort: Pulmonary effort is normal. No respiratory distress.     Breath sounds: Normal breath sounds. No stridor. No wheezing or rales.  Chest:     Chest wall: No tenderness.  Abdominal:     General: Bowel sounds are normal. There is no distension.     Palpations: Abdomen is soft. There is no mass.     Tenderness: There is no abdominal tenderness. There is no guarding or rebound.  Genitourinary:    Penis: Normal.      Prostate: Normal.     Rectum: Normal.  Musculoskeletal:        General: No tenderness or deformity. Normal range of motion.     Cervical back: Normal range of motion and neck supple.  Lymphadenopathy:     Cervical: No cervical adenopathy.  Skin:    General: Skin is warm.     Coloration: Skin is not pale.     Findings: No erythema or rash.  Neurological:     Mental Status: He is alert and oriented to person, place, and time.     Cranial Nerves: No cranial nerve deficit.     Motor: No abnormal muscle tone.     Coordination: Coordination normal.     Deep Tendon Reflexes: Reflexes are normal and symmetric.  Psychiatric:        Behavior: Behavior normal.        Thought Content: Thought content normal.        Judgment: Judgment normal.        Assessment:     General medical exam  Pure hypercholesterolemia  Stage 3 chronic kidney disease, unspecified whether stage 3a or 3b CKD  Cryptogenic stroke ([place])  Tobacco abuse  Hypertriglyceridemia       Plan:     Patient's blood pressure today is excellent.  Discontinue aspirin and switch to Plavix 75 mg alone for secondary prevention of stroke.  LDL cholesterol 79.  Ideally I would like it to be below 70.  Discussed dietary changes including  eating a diet rich in vegetables and fruits and eating less red meat and pork along with less fried food and saturated fat.  Patient is walking a mile or so a day 3 to 4 days a week.  Continue to recommend abstinence from tobacco.  Immunizations are up-to-date.  Colonoscopy is due in 2023.  I will add a PSA to his lab work to screen for prostate cancer.  Patient denies any falls, depression, or memory loss.  Recommended avoidance of NSAIDs given his chronic kidney disease.

## 2019-08-15 ENCOUNTER — Ambulatory Visit (INDEPENDENT_AMBULATORY_CARE_PROVIDER_SITE_OTHER): Payer: Medicare Other | Admitting: *Deleted

## 2019-08-15 DIAGNOSIS — I6340 Cerebral infarction due to embolism of unspecified cerebral artery: Secondary | ICD-10-CM

## 2019-08-15 LAB — CUP PACEART REMOTE DEVICE CHECK
Date Time Interrogation Session: 20210808173656
Implantable Pulse Generator Implant Date: 20210503

## 2019-08-16 NOTE — Progress Notes (Signed)
Carelink Summary Report / Loop Recorder 

## 2019-09-19 ENCOUNTER — Ambulatory Visit (INDEPENDENT_AMBULATORY_CARE_PROVIDER_SITE_OTHER): Payer: Medicare Other | Admitting: *Deleted

## 2019-09-19 DIAGNOSIS — I639 Cerebral infarction, unspecified: Secondary | ICD-10-CM

## 2019-09-19 LAB — CUP PACEART REMOTE DEVICE CHECK
Date Time Interrogation Session: 20210910173259
Implantable Pulse Generator Implant Date: 20210503

## 2019-09-20 NOTE — Progress Notes (Signed)
Carelink Summary Report / Loop Recorder 

## 2019-10-04 DIAGNOSIS — H40023 Open angle with borderline findings, high risk, bilateral: Secondary | ICD-10-CM | POA: Diagnosis not present

## 2019-10-04 DIAGNOSIS — H52203 Unspecified astigmatism, bilateral: Secondary | ICD-10-CM | POA: Diagnosis not present

## 2019-10-04 DIAGNOSIS — H2513 Age-related nuclear cataract, bilateral: Secondary | ICD-10-CM | POA: Diagnosis not present

## 2019-10-17 ENCOUNTER — Encounter: Payer: Self-pay | Admitting: Adult Health

## 2019-10-17 ENCOUNTER — Ambulatory Visit: Payer: Medicare Other | Admitting: Adult Health

## 2019-10-17 ENCOUNTER — Other Ambulatory Visit: Payer: Self-pay

## 2019-10-17 VITALS — BP 121/77 | HR 68 | Ht 69.0 in | Wt 175.0 lb

## 2019-10-17 DIAGNOSIS — E785 Hyperlipidemia, unspecified: Secondary | ICD-10-CM | POA: Diagnosis not present

## 2019-10-17 DIAGNOSIS — R519 Headache, unspecified: Secondary | ICD-10-CM | POA: Diagnosis not present

## 2019-10-17 DIAGNOSIS — I639 Cerebral infarction, unspecified: Secondary | ICD-10-CM | POA: Diagnosis not present

## 2019-10-17 NOTE — Patient Instructions (Signed)
Continue clopidogrel 75 mg daily  and atorvastatin  for secondary stroke prevention  Continue to follow up with PCP regarding cholesterol management with LDL cholesterol (bad cholesterol) goal below 70 mg/dL.   Please continue to monitor headaches and if worsen, please call office to discuss possible need of medication management  Loop recorder will continue to be monitored for possible atrial fibrillation       Followup in the future with me in 6 months or call earlier if needed       Thank you for coming to see [place] at [facility]. I hope we have been able to provide you high quality care today.  You may receive a patient satisfaction survey over the next few weeks. We would appreciate your feedback and comments so that we may continue to improve ourselves and the health of our patients.

## 2019-10-17 NOTE — Progress Notes (Signed)
[facility] [address] [place]. [place] [address] [phone]       STROKE FOLLOW UP NOTE  [name] Date of Birth:  02/19/1945 Medical Record Number:  [id number]   Reason for Referral: stroke follow up    SUBJECTIVE:   CHIEF COMPLAINT:  Chief Complaint  Patient presents with  . Follow-up    pt states he is doing well, reports daily mild headaches, back of head,   . tx room    alone    HPI:   Today, 10/17/2019, Christian [name] returns for stroke follow-up unaccompanied.  He continues to experience daily posterior headache which has been present since his stroke.  Typically dull sensation 1/10 pain scale, occasionally can increase to 3-4/10 pain scale and will take Tylenol with benefit.  Headaches are not debilitating and do not interfere with daily activity.  Denies residual dizziness or any other stroke deficits.  Denies new stroke/TIA symptoms.  Completed 3 months DAPT and remains on Plavix alone without bleeding or bruising.  Remains on atorvastatin 40 mg daily without myalgias.  Blood pressure today 121/77.  Loop recorder has not shown atrial fibrillation thus far.  No concerns at this time.    History provided for reference purposes only Initial visit 06/16/2019 JM: Christian [name] is being seen for hospital follow-up accompanied by his wife.  He has been doing well since discharge with intermittent dizziness usually with overexertion but endorses ongoing improvement.  Denies residual speech or visual impairment.  He does report mild right posterior headache which has been present since his stroke with ongoing improvement.  Will occasionally take Tylenol with benefit. Continues on DAPT with aspirin 325 mg daily and clopidogrel 75 mg daily without bleeding or bruising.  Continues on atorvastatin without myalgias.  Blood pressure today 128/74.  Loop recorder has not shown atrial fibrillation thus far.  No concerns at this time.  Stroke admission  05/07/2018 Christian [name] is a 74 y.o. male with history of [facility] HLD, ASPVD, CKD 3, tobacco abuse who presented on 05/07/2018 to [facility] ED as a code stroke for dizziness, dysarthria, diplopia and left facial droop.   Stroke work-up revealed multiple bilateral strokes (right cerebellar tonsil, right cerebellum and left pons as well as left parietal lobe) embolic pattern concerning for cardioembolic source therefore loop recorder placed to evaluate for atrial fibrillation as potential etiology.  CTA showed occlusion of distal R VA at C1 level through the V4 level..  Recommended DAPT for 3 months and then Plavix alone given right VA occlusion.  No prior history of HTN BP stable during mission.  LDL 62 and continued atorvastatin 40 mg daily.  Other stroke risk factors include current tobacco use, EtOH use and family history of stroke but no personal history of stroke.  Evaluated by therapy who recommended outpatient PT/OT therapy and discharged home in stable condition.  Stroke:  Multiple bilateral strokes - embolic pattern - concerning for cardioembolic source  CT Head - No acute abnormality. Atrophy and chronic microvascular ischemia. ASPECTS is 10  CTA H&N - Occlusion of the distal right vertebral artery at the C1 level through the V4 level. This has ill-defined margins and likely is acute either due dissection or less likely atherosclerotic disease.   MRI head - Acute infarct in the right cerebellar tonsil, right cerebellum, and left pons. Small acute infarct in the left parietal lobe. Occlusion distal right vertebral artery which appears acute on CTA.   2D Echo EF 65-70%  LE venous  Doppler no DVT  For loop recorder placement 05/09/2019  Lacey Jensen Virus 2 - negative  LDL - 62  HgbA1c - 5.9  VTE prophylaxis - [facility] Heparin / SCDs  aspirin 81 mg daily prior to admission, now on aspirin 325 mg daily and clopidogrel 75 mg daily.  Continue DAPT for 3 months given right VA occlusion and then Plavix  alone.  Therapy recommendations: Home health OT, OP PT, no SLP -> OP therapy follow up arranged by TOC  Disposition:  Return home     ROS:   14 system review of systems performed and negative with exception of headache  PMH:  Past Medical History:  Diagnosis Date  . Adenomatous colon polyp   . Allergy    seasonal  . CKD (chronic kidney disease) stage 3, GFR 30-59 ml/min (HCC)   . Hyperlipidemia   . Hypertriglyceridemia   . PVD (peripheral vascular disease) (HCC)    decreased TBI bilaterally  . Smoker     PSH:  Past Surgical History:  Procedure Laterality Date  . CHOLECYSTECTOMY  1987  . EYE SURGERY     lasik  . LOOP RECORDER INSERTION N/A 05/09/2019   Procedure: LOOP RECORDER INSERTION;  Surgeon: Evans Lance, MD;  Location: [facility];  Service: Cardiovascular;  Laterality: N/A;    Social History:  Social History   Socioeconomic History  . Marital status: Married    Spouse name: Not on file  . Number of children: Not on file  . Years of education: Not on file  . Highest education level: Not on file  Occupational History  . Not on file  Tobacco Use  . Smoking status: Current Every Day Smoker    Packs/day: 0.25    Types: Cigarettes  . Smokeless tobacco: Never Used  Vaping Use  . Vaping Use: Never used  Substance and Sexual Activity  . Alcohol use: Yes    Alcohol/week: 14.0 standard drinks    Types: 7 Shots of liquor, 7 Standard drinks or equivalent per week    Comment: 1 drink per day per pt  . Drug use: No  . Sexual activity: Not on file  Other Topics Concern  . Not on file  Social History Narrative  . Not on file   Social Determinants of Health   Financial Resource Strain:   . Difficulty of Paying Living Expenses: Not on file  Food Insecurity:   . Worried About Charity fundraiser in the Last Year: Not on file  . Ran Out of Food in the Last Year: Not on file  Transportation Needs: No Transportation Needs  . Lack of Transportation  (Medical): No  . Lack of Transportation (Non-Medical): No  Physical Activity:   . Days of Exercise per Week: Not on file  . Minutes of Exercise per Session: Not on file  Stress:   . Feeling of Stress : Not on file  Social Connections:   . Frequency of Communication with Friends and Family: Not on file  . Frequency of Social Gatherings with Friends and Family: Not on file  . Attends Religious Services: Not on file  . Active Member of Clubs or Organizations: Not on file  . Attends Archivist Meetings: Not on file  . Marital Status: Not on file  Intimate Partner Violence:   . Fear of Current or Ex-Partner: Not on file  . Emotionally Abused: Not on file  . Physically Abused: Not on file  . Sexually Abused: Not  on file    Family History:  Family History  Problem Relation Age of Onset  . Hypertension Mother   . Stroke Mother   . Diabetes Mother        borderline  . Arthritis Father   . ALS Son   . Colon cancer Neg Hx   . Esophageal cancer Neg Hx   . Rectal cancer Neg Hx   . Stomach cancer Neg Hx     Medications:   Current Outpatient Medications on File Prior to Visit  Medication Sig Dispense Refill  . acetaminophen (TYLENOL) 325 MG tablet Take 2 tablets (650 mg total) by mouth every 4 (four) hours as needed for mild pain (or temp > 37.5 C (99.5 F)).    Marland Kitchen atorvastatin (LIPITOR) 40 MG tablet TAKE 1 TABLET BY MOUTH EVERY DAY 90 tablet 3  . cholecalciferol (VITAMIN D) 1000 UNITS tablet Take 1,000 Units by mouth daily.    . clopidogrel (PLAVIX) 75 MG tablet Take 1 tablet (75 mg total) by mouth daily. 90 tablet 3  . diazepam (VALIUM) 5 MG tablet Take 1 tablet (5 mg total) by mouth every 12 (twelve) hours as needed for anxiety. 30 tablet 1  . fluticasone (FLONASE) 50 MCG/ACT nasal spray PLACE 2 SPRAYS INTO EACH NOSTRIL DAILY (Patient taking differently: Place 2 sprays into both nostrils daily. ) 48 mL 2  . loratadine (CLARITIN) 10 MG tablet Take 10 mg by mouth daily.    .  Multiple Vitamins-Minerals (MULTIVITAMIN WITH MINERALS) tablet Take 1 tablet by mouth daily.    . vitamin C (ASCORBIC ACID) 500 MG tablet Take 500 mg by mouth daily.     No current facility-administered medications on file prior to visit.    Allergies:  No Known Allergies    OBJECTIVE:  Physical Exam  Vitals:   10/17/19 0828  BP: 121/77  Pulse: 68  Weight: 175 lb (79.4 kg)  Height: 5\' 9"  (1.753 m)   Body mass index is 25.84 kg/m. No exam data present   General: well developed, well nourished,  pleasant elderly Caucasian male, seated, in no evident distress Head: head normocephalic and atraumatic.   Neck: supple with no carotid or supraclavicular bruits Cardiovascular: regular rate and rhythm, no murmurs Musculoskeletal: no deformity Skin:  no rash/petichiae Vascular:  Normal pulses all extremities   Neurologic Exam Mental Status: Awake and fully alert. Fluent speech and language. Oriented to place and time. Recent and remote memory intact. Attention span, concentration and fund of knowledge appropriate. Mood and affect appropriate.  Cranial Nerves: Pupils equal, briskly reactive to light. Extraocular movements full without nystagmus. Visual fields full to confrontation. Hearing intact. Facial sensation intact. Face, tongue, palate moves normally and symmetrically.  Motor: Normal bulk and tone. Normal strength in all tested extremity muscles. Sensory.: intact to touch , pinprick , position and vibratory sensation.  Coordination: Rapid alternating movements normal in all extremities. Finger-to-nose and heel-to-shin performed accurately bilaterally. Gait and Station: Arises from chair without difficulty. Stance is normal. Gait demonstrates normal stride length and balance without use of assistive device.  Able to heel toe and stand on single leg alone with only mild difficulty.  Romberg negative. Reflexes: 1+ and symmetric. Toes downgoing.        ASSESSMENT: Christian [name]  is a 74 y.o. year old male presented with dizziness, dysarthria, diplopia and left facial droop on 05/07/2019 with stroke work-up revealing multiple bilateral strokes (right cerebellar tonsil, right cerebellum and left pons as well as left parietal  lobe) with right VA occlusion, embolic pattern concerning for cardioembolic source.  Loop recorder placed to evaluate for atrial fibrillation as possible etiology.  Vascular risk factors include HLD, tobacco use and age.     PLAN:  1. Multiple bilateral strokes:  a. Residual deficit: Posterior headache not debilitating and will take Tylenol with benefit.  He declines interest in daily medication management as headaches are mild and will only need Tylenol on occasion.  Advised him to call office if he is interested in initiating medication management b. Continue clopidogrel 75 mg daily  and atorvastatin 40 mg daily for secondary stroke prevention c. Loop recorder has not shown atrial fibrillation thus far -monitored routinely by cardiology d. Discussed secondary stroke prevention measures and importance of close PCP follow-up for aggressive stroke risk factor management 2. HLD: LDL goal<70.  Lipid panel 08/09/2019 showed LDL 79.  Continue atorvastatin 40 mg daily per PCP    Follow up in 6 months or call earlier if needed   I spent 25 minutes of face-to-face and non-face-to-face time with patient.  This included previsit chart review, lab review, study review, order entry, electronic health record documentation, patient education regarding recent stroke, residual posterior headache, secondary stroke prevention measures and importance of managing stroke risk factors and answered all questions to patient satisfaction   Frann Rider, [facility]  [facility] [address] [place], [place] [address]  Phone [phone] Fax [phone] Note: This document was prepared with digital dictation and possible smart phrase  technology. Any transcriptional errors that result from this process are unintentional.

## 2019-10-17 NOTE — Progress Notes (Signed)
I agree with the above plan 

## 2019-10-20 LAB — CUP PACEART REMOTE DEVICE CHECK
Date Time Interrogation Session: 20211013173602
Implantable Pulse Generator Implant Date: 20210503

## 2019-10-24 ENCOUNTER — Ambulatory Visit (INDEPENDENT_AMBULATORY_CARE_PROVIDER_SITE_OTHER): Payer: Medicare Other

## 2019-10-24 DIAGNOSIS — I6340 Cerebral infarction due to embolism of unspecified cerebral artery: Secondary | ICD-10-CM | POA: Diagnosis not present

## 2019-10-26 ENCOUNTER — Other Ambulatory Visit: Payer: Self-pay | Admitting: Family Medicine

## 2019-10-31 NOTE — Progress Notes (Signed)
Carelink Summary Report / Loop Recorder 

## 2019-11-28 ENCOUNTER — Ambulatory Visit (INDEPENDENT_AMBULATORY_CARE_PROVIDER_SITE_OTHER): Payer: Medicare Other

## 2019-11-28 DIAGNOSIS — I6340 Cerebral infarction due to embolism of unspecified cerebral artery: Secondary | ICD-10-CM | POA: Diagnosis not present

## 2019-11-28 LAB — CUP PACEART REMOTE DEVICE CHECK
Date Time Interrogation Session: 20211122000811
Implantable Pulse Generator Implant Date: 20210503

## 2019-11-29 NOTE — Progress Notes (Signed)
Carelink Summary Report / Loop Recorder 

## 2020-01-02 ENCOUNTER — Ambulatory Visit (INDEPENDENT_AMBULATORY_CARE_PROVIDER_SITE_OTHER): Payer: Medicare Other

## 2020-01-02 DIAGNOSIS — I639 Cerebral infarction, unspecified: Secondary | ICD-10-CM | POA: Diagnosis not present

## 2020-01-03 LAB — CUP PACEART REMOTE DEVICE CHECK
Date Time Interrogation Session: 20211225000143
Implantable Pulse Generator Implant Date: 20210503

## 2020-01-12 NOTE — Progress Notes (Signed)
Carelink Summary Report / Loop Recorder 

## 2020-02-02 ENCOUNTER — Ambulatory Visit (INDEPENDENT_AMBULATORY_CARE_PROVIDER_SITE_OTHER): Payer: Medicare Other

## 2020-02-02 DIAGNOSIS — I6340 Cerebral infarction due to embolism of unspecified cerebral artery: Secondary | ICD-10-CM

## 2020-02-02 LAB — CUP PACEART REMOTE DEVICE CHECK
Date Time Interrogation Session: 20220127000827
Implantable Pulse Generator Implant Date: 20210503

## 2020-02-11 IMAGING — US US RENAL
1 series · 14 of 25 positions shown · non-contrast
Comparison: 08/13/2012

CLINICAL DATA: Chronic kidney disease

EXAM:
RENAL / URINARY TRACT ULTRASOUND COMPLETE

[Series 1: us renal · 14 of 55 slices shown]
[im 1/55]
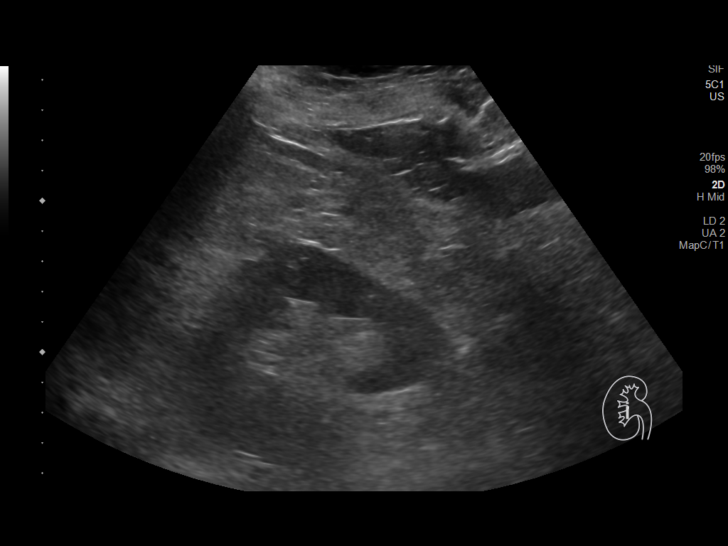
[im 5/55]
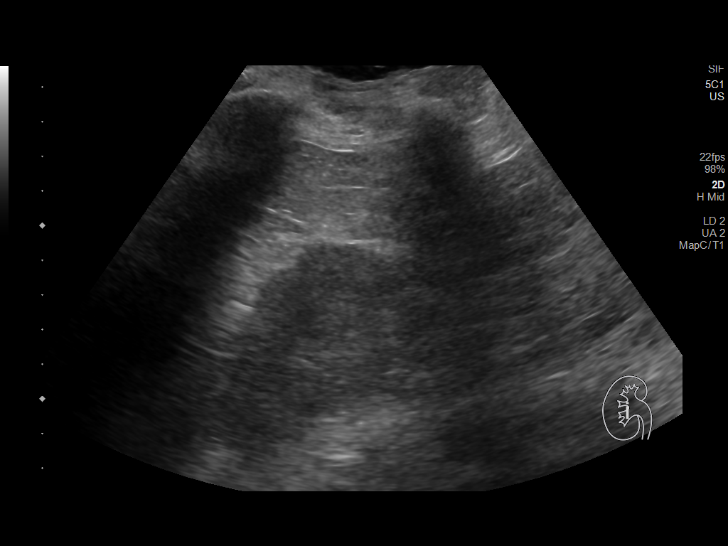
[im 10/55]
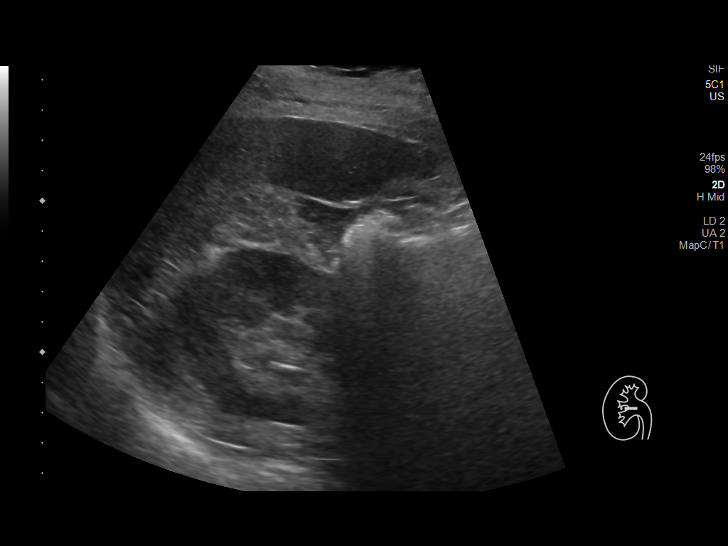
[im 14/55]
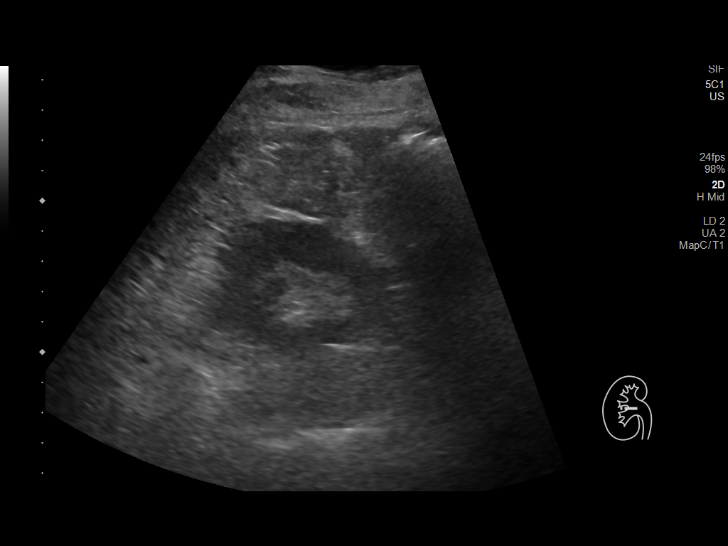
[im 19/55]
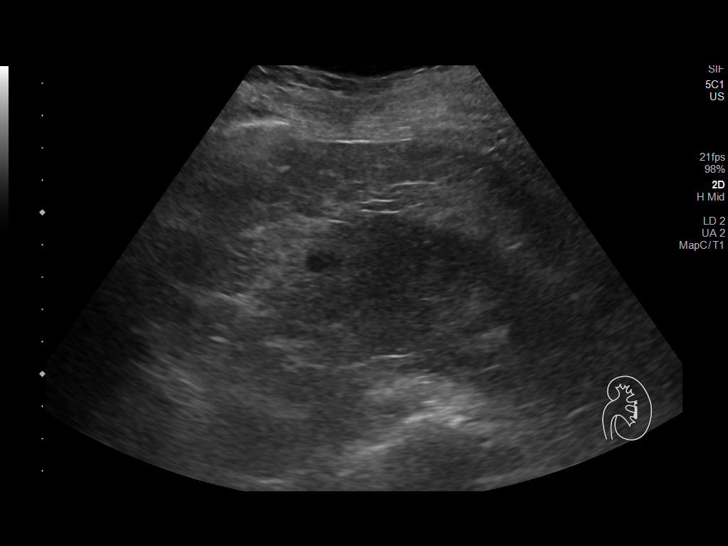
[im 21/55]
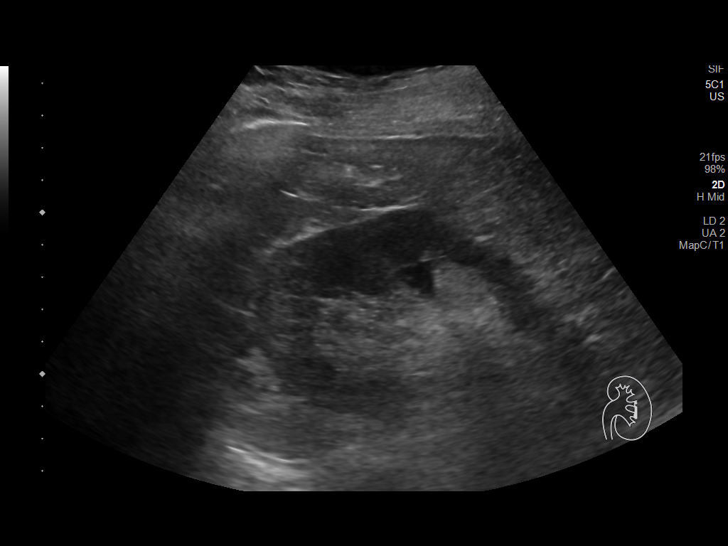
[im 25/55]
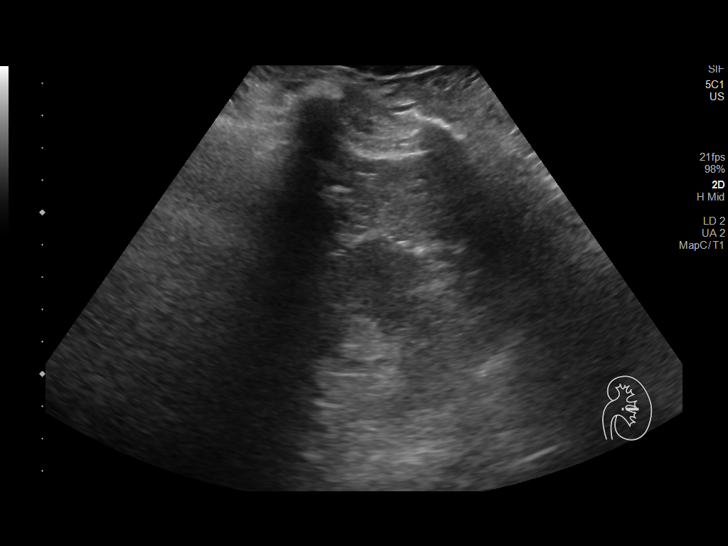
[im 30/55]
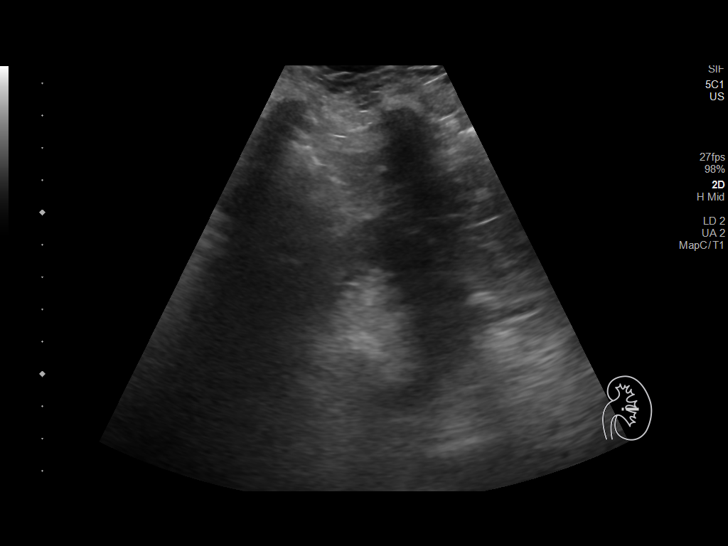
[im 34/55]
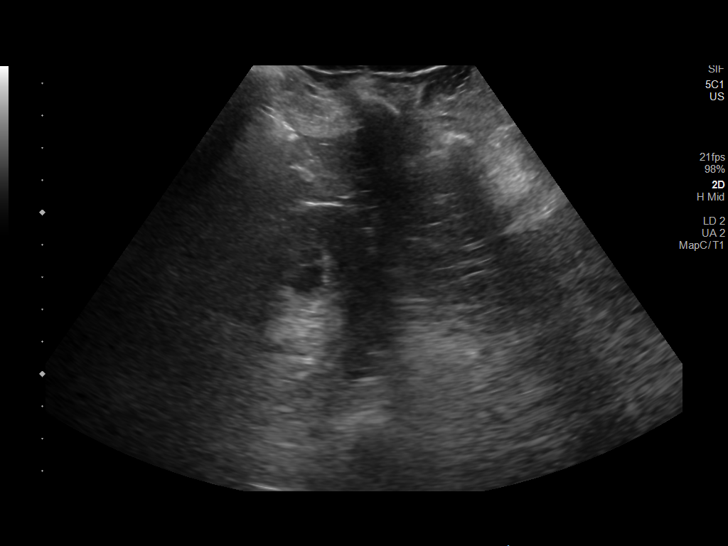
[im 37/55]
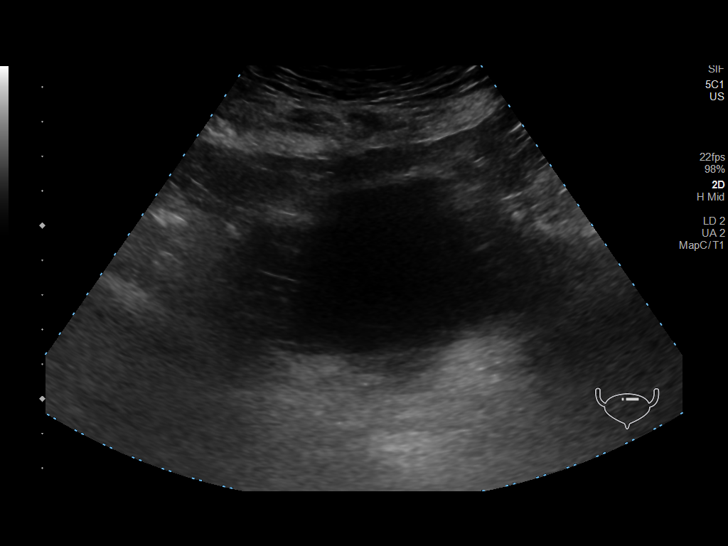
[im 41/55]
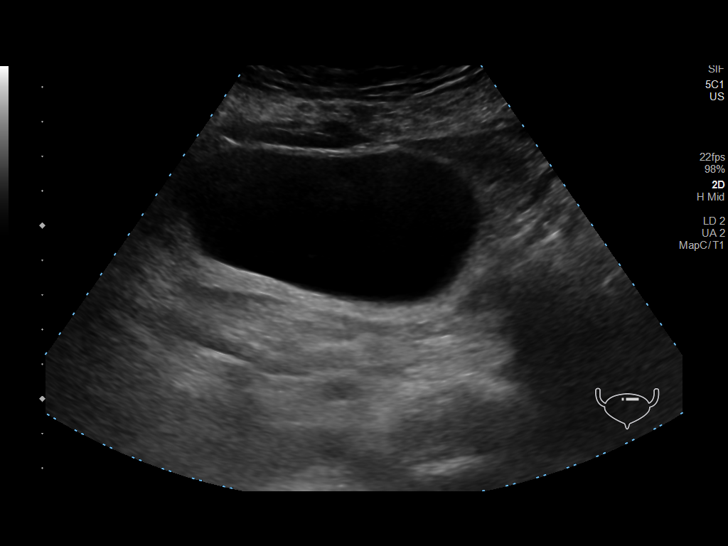
[im 46/55]
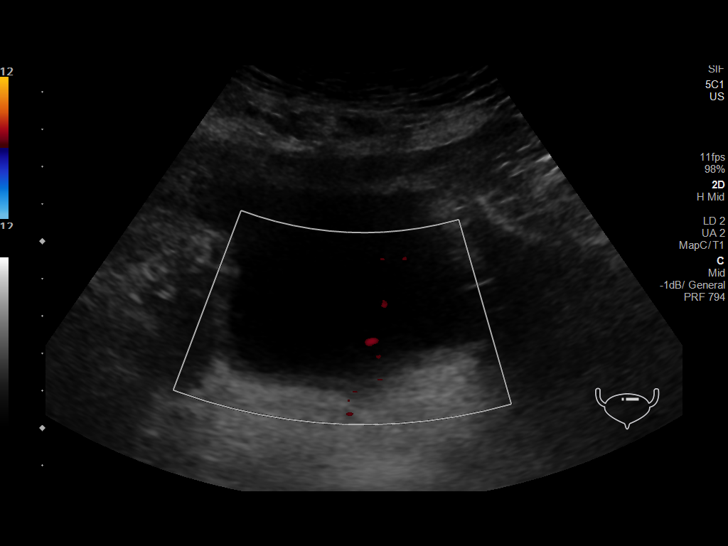
[im 50/55]
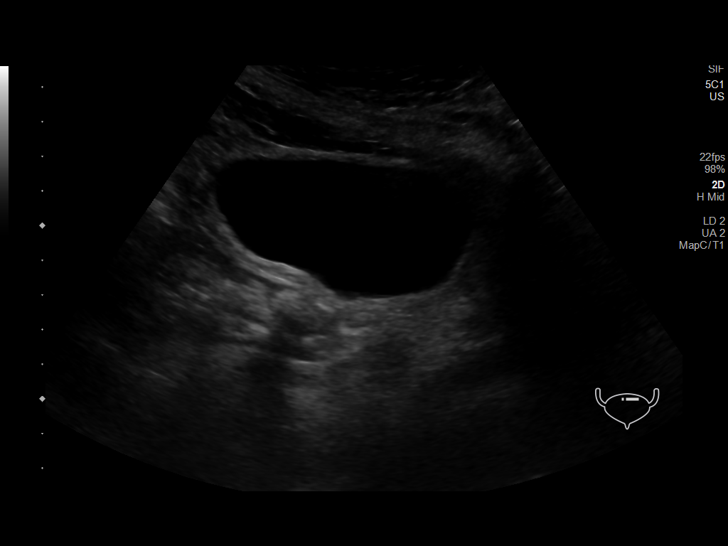
[im 55/55]
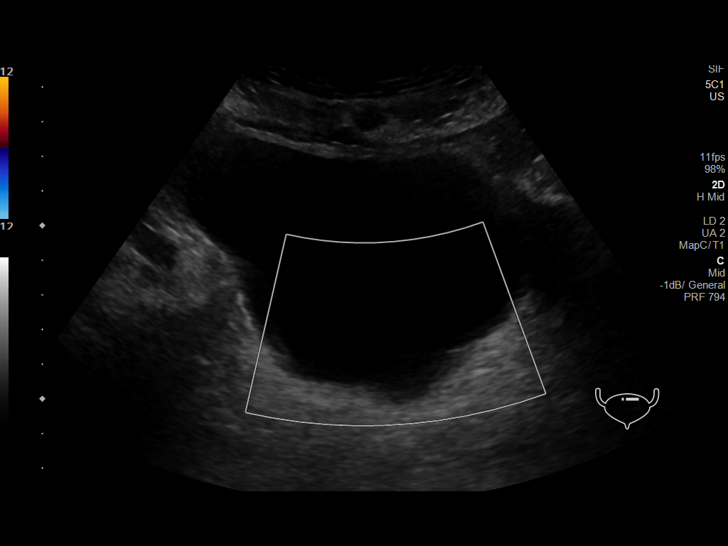

[14 of 25 positions shown; findings below may reference images not displayed]

FINDINGS: Right Kidney:

Renal measurements: 9.4 x 4.8 x 5.0 cm = volume: 118 mL .
Echogenicity within normal limits. No mass or hydronephrosis
visualized.

Left Kidney:

Renal measurements: 9.3 x 6.2 x 4.3 cm = volume: 129 mL.
cyst. No hydronephrosis.

Bladder:

Within normal limits.
IMPRESSION: 12 mm left upper pole simple cyst, benign.

No hydronephrosis.

## 2020-02-13 ENCOUNTER — Other Ambulatory Visit: Payer: Self-pay

## 2020-02-13 ENCOUNTER — Ambulatory Visit (INDEPENDENT_AMBULATORY_CARE_PROVIDER_SITE_OTHER): Payer: Medicare Other | Admitting: Family Medicine

## 2020-02-13 VITALS — BP 98/62 | HR 88 | Temp 97.5°F | Resp 18 | Wt 175.0 lb

## 2020-02-13 DIAGNOSIS — I639 Cerebral infarction, unspecified: Secondary | ICD-10-CM | POA: Diagnosis not present

## 2020-02-13 DIAGNOSIS — Z122 Encounter for screening for malignant neoplasm of respiratory organs: Secondary | ICD-10-CM | POA: Diagnosis not present

## 2020-02-13 DIAGNOSIS — I6930 Unspecified sequelae of cerebral infarction: Secondary | ICD-10-CM

## 2020-02-13 DIAGNOSIS — E7800 Pure hypercholesterolemia, unspecified: Secondary | ICD-10-CM

## 2020-02-13 DIAGNOSIS — Z720 Tobacco use: Secondary | ICD-10-CM | POA: Diagnosis not present

## 2020-02-13 DIAGNOSIS — N183 Chronic kidney disease, stage 3 unspecified: Secondary | ICD-10-CM | POA: Diagnosis not present

## 2020-02-13 DIAGNOSIS — R7309 Other abnormal glucose: Secondary | ICD-10-CM | POA: Diagnosis not present

## 2020-02-13 NOTE — Progress Notes (Signed)
Subjective:     Patient ID: Christian [name], male   DOB: 11-30-1945, 75 y.o.   MRN: [id number]  HPI Patient is a 75 year old with a history of HLD, PVD, CKD stage III, and tobacco abuse who suffered multiple bilateral embolic type CVAs in a pattern worrisome for cardioembolic source in May 123XX123.  CTanoted occlusion of the distal right vertebral artery at the C1 level through the V4 level. At that time, a MRI noted acute infarct right cerebellar tonsil, right cerebellum, and left pons with small acute infarct in the left parietal lobe.  Neurology consult recommended aspirin 325 mg plus Plavix x3 months then Plavix alone.  He is consistently taking his Plavix.  He denies any other stroke like symptoms.  His loop recorder thus far has been negative for any cardiac arrhythmias.  Unfortunately he continues to smoke.  He denies any chest pain shortness of breath or dyspnea on exertion.  He has been screened for a AAA.  However he has never been screened for lung cancer.  He is due for fasting lab work today.  He denies any myalgias on his statin  Past Medical History:  Diagnosis Date  . Adenomatous colon polyp   . Allergy    seasonal  . CKD (chronic kidney disease) stage 3, GFR 30-59 ml/min (HCC)   . Hyperlipidemia   . Hypertriglyceridemia   . PVD (peripheral vascular disease) (HCC)    decreased TBI bilaterally  . Smoker    Past Surgical History:  Procedure Laterality Date  . CHOLECYSTECTOMY  1987  . EYE SURGERY     lasik  . LOOP RECORDER INSERTION N/A 05/09/2019   Procedure: LOOP RECORDER INSERTION;  Surgeon: Evans Lance, MD;  Location: [facility];  Service: Cardiovascular;  Laterality: N/A;   Current Outpatient Medications on File Prior to Visit  Medication Sig Dispense Refill  . acetaminophen (TYLENOL) 325 MG tablet Take 2 tablets (650 mg total) by mouth every 4 (four) hours as needed for mild pain (or temp > 37.5 C (99.5 F)).    Marland Kitchen atorvastatin (LIPITOR) 40 MG tablet TAKE 1 TABLET  BY MOUTH EVERY DAY 90 tablet 3  . cholecalciferol (VITAMIN D) 1000 UNITS tablet Take 1,000 Units by mouth daily.    . clopidogrel (PLAVIX) 75 MG tablet Take 1 tablet (75 mg total) by mouth daily. 90 tablet 3  . diazepam (VALIUM) 5 MG tablet Take 1 tablet (5 mg total) by mouth every 12 (twelve) hours as needed for anxiety. 30 tablet 1  . fluticasone (FLONASE) 50 MCG/ACT nasal spray PLACE 2 SPRAYS INTO EACH NOSTRIL DAILY 48 mL 2  . loratadine (CLARITIN) 10 MG tablet Take 10 mg by mouth daily.    . Multiple Vitamins-Minerals (MULTIVITAMIN WITH MINERALS) tablet Take 1 tablet by mouth daily.    . vitamin C (ASCORBIC ACID) 500 MG tablet Take 500 mg by mouth daily.     No current facility-administered medications on file prior to visit.   No Known Allergies Social History   Socioeconomic History  . Marital status: Married    Spouse name: Not on file  . Number of children: Not on file  . Years of education: Not on file  . Highest education level: Not on file  Occupational History  . Not on file  Tobacco Use  . Smoking status: Current Every Day Smoker    Packs/day: 0.25    Types: Cigarettes  . Smokeless tobacco: Never Used  Vaping Use  . Vaping Use: Never  used  Substance and Sexual Activity  . Alcohol use: Yes    Alcohol/week: 14.0 standard drinks    Types: 7 Shots of liquor, 7 Standard drinks or equivalent per week    Comment: 1 drink per day per pt  . Drug use: No  . Sexual activity: Not on file  Other Topics Concern  . Not on file  Social History Narrative  . Not on file   Social Determinants of Health   Financial Resource Strain: Not on file  Food Insecurity: Not on file  Transportation Needs: No Transportation Needs  . Lack of Transportation (Medical): No  . Lack of Transportation (Non-Medical): No  Physical Activity: Not on file  Stress: Not on file  Social Connections: Not on file  Intimate Partner Violence: Not on file     Review of Systems  All other systems  reviewed and are negative.      Objective:   Physical Exam Vitals reviewed.  Constitutional:      General: He is not in acute distress.    Appearance: Normal appearance. He is normal weight. He is not ill-appearing, toxic-appearing or diaphoretic.  Cardiovascular:     Rate and Rhythm: Normal rate and regular rhythm.     Heart sounds: Normal heart sounds. No murmur heard. No friction rub. No gallop.   Pulmonary:     Effort: Pulmonary effort is normal. No respiratory distress.     Breath sounds: Normal breath sounds. No wheezing, rhonchi or rales.  Chest:     Chest wall: No tenderness.  Abdominal:     General: Abdomen is flat. Bowel sounds are normal. There is no distension.     Palpations: Abdomen is soft.     Tenderness: There is no abdominal tenderness. There is no guarding.  Musculoskeletal:     Right lower leg: No edema.     Left lower leg: No edema.  Neurological:     General: No focal deficit present.     Mental Status: He is alert and oriented to person, place, and time. Mental status is at baseline.     Cranial Nerves: No cranial nerve deficit.     Sensory: No sensory deficit.     Motor: No weakness.     Coordination: Coordination normal.     Gait: Gait normal.     Deep Tendon Reflexes: Reflexes normal.        Assessment:     Cryptogenic stroke (HCC) - Plan: CBC with Differential/Platelet, COMPLETE METABOLIC PANEL WITH GFR, Lipid panel  Tobacco abuse - Plan: CBC with Differential/Platelet, COMPLETE METABOLIC PANEL WITH GFR, Lipid panel  Pure hypercholesterolemia - Plan: CBC with Differential/Platelet, COMPLETE METABOLIC PANEL WITH GFR, Lipid panel  Stage 3 chronic kidney disease, unspecified whether stage 3a or 3b CKD (HCC) - Plan: CBC with Differential/Platelet, COMPLETE METABOLIC PANEL WITH GFR, Lipid panel  Encounter for screening for lung cancer - Plan: CT CHEST LUNG CA SCREEN LOW DOSE W/O CM      Plan:     Fortunately, loop recorder has shown no  evidence of cardiac arrhythmias.  Therefore we will continue Plavix.  I continue to encourage the patient to quit smoking.  I will check a lipid panel along with a CBC and a CMP today.  I would like to try to keep his LDL cholesterol below 70 and is close to 50 as possible.  Blood pressure today is acceptable.  Continue to monitor his kidney disease.  He is also due for lung cancer  screening so I will schedule the patient for a CT scan of the chest

## 2020-02-13 NOTE — Progress Notes (Signed)
Carelink Summary Report / Loop Recorder 

## 2020-02-15 LAB — CBC WITH DIFFERENTIAL/PLATELET
Absolute Monocytes: 664 {cells}/uL (ref 200–950)
Basophils Absolute: 101 {cells}/uL (ref 0–200)
Basophils Relative: 1.2 %
Eosinophils Absolute: 202 {cells}/uL (ref 15–500)
Eosinophils Relative: 2.4 %
HCT: 46.9 % (ref 38.5–50.0)
Hemoglobin: 16 g/dL (ref 13.2–17.1)
Lymphs Abs: 3452 {cells}/uL (ref 850–3900)
MCH: 30.4 pg (ref 27.0–33.0)
MCHC: 34.1 g/dL (ref 32.0–36.0)
MCV: 89.2 fL (ref 80.0–100.0)
MPV: 10 fL (ref 7.5–12.5)
Monocytes Relative: 7.9 %
Neutro Abs: 3982 {cells}/uL (ref 1500–7800)
Neutrophils Relative %: 47.4 %
Platelets: 283 Thousand/uL (ref 140–400)
RBC: 5.26 Million/uL (ref 4.20–5.80)
RDW: 13.1 % (ref 11.0–15.0)
Total Lymphocyte: 41.1 %
WBC: 8.4 Thousand/uL (ref 3.8–10.8)

## 2020-02-15 LAB — LIPID PANEL
Cholesterol: 141 mg/dL
HDL: 58 mg/dL
LDL Cholesterol (Calc): 63 mg/dL
Non-HDL Cholesterol (Calc): 83 mg/dL
Total CHOL/HDL Ratio: 2.4 (calc)
Triglycerides: 112 mg/dL

## 2020-02-15 LAB — COMPLETE METABOLIC PANEL WITHOUT GFR
AG Ratio: 1.7 (calc) (ref 1.0–2.5)
ALT: 29 U/L (ref 9–46)
AST: 17 U/L (ref 10–35)
Albumin: 4 g/dL (ref 3.6–5.1)
Alkaline phosphatase (APISO): 118 U/L (ref 35–144)
BUN/Creatinine Ratio: 11 (calc) (ref 6–22)
BUN: 15 mg/dL (ref 7–25)
CO2: 26 mmol/L (ref 20–32)
Calcium: 9.4 mg/dL (ref 8.6–10.3)
Chloride: 106 mmol/L (ref 98–110)
Creat: 1.31 mg/dL — ABNORMAL HIGH (ref 0.70–1.18)
GFR, Est African American: 61 mL/min/1.73m2
GFR, Est Non African American: 53 mL/min/1.73m2 — ABNORMAL LOW
Globulin: 2.4 g/dL (ref 1.9–3.7)
Glucose, Bld: 118 mg/dL — ABNORMAL HIGH (ref 65–99)
Potassium: 4.8 mmol/L (ref 3.5–5.3)
Sodium: 140 mmol/L (ref 135–146)
Total Bilirubin: 0.7 mg/dL (ref 0.2–1.2)
Total Protein: 6.4 g/dL (ref 6.1–8.1)

## 2020-02-15 LAB — TEST AUTHORIZATION

## 2020-02-15 LAB — HEMOGLOBIN A1C: Hgb A1c MFr Bld: 5.6 %{Hb}

## 2020-02-16 ENCOUNTER — Encounter: Payer: Self-pay | Admitting: *Deleted

## 2020-02-28 ENCOUNTER — Ambulatory Visit
Admission: RE | Admit: 2020-02-28 | Discharge: 2020-02-28 | Disposition: A | Discharge status: Home / Self Care | Payer: Medicare Other | Source: Ambulatory Visit | Attending: Family Medicine | Admitting: Family Medicine

## 2020-02-28 DIAGNOSIS — J439 Emphysema, unspecified: Secondary | ICD-10-CM | POA: Diagnosis not present

## 2020-02-28 DIAGNOSIS — Z122 Encounter for screening for malignant neoplasm of respiratory organs: Secondary | ICD-10-CM

## 2020-02-28 DIAGNOSIS — N200 Calculus of kidney: Secondary | ICD-10-CM | POA: Diagnosis not present

## 2020-02-28 DIAGNOSIS — I2510 Atherosclerotic heart disease of native coronary artery without angina pectoris: Secondary | ICD-10-CM | POA: Diagnosis not present

## 2020-02-28 DIAGNOSIS — Z87891 Personal history of nicotine dependence: Secondary | ICD-10-CM | POA: Diagnosis not present

## 2020-03-06 ENCOUNTER — Other Ambulatory Visit: Payer: Self-pay | Admitting: *Deleted

## 2020-03-06 ENCOUNTER — Ambulatory Visit (INDEPENDENT_AMBULATORY_CARE_PROVIDER_SITE_OTHER): Payer: Medicare Other

## 2020-03-06 DIAGNOSIS — I639 Cerebral infarction, unspecified: Secondary | ICD-10-CM

## 2020-03-06 LAB — CUP PACEART REMOTE DEVICE CHECK
Date Time Interrogation Session: 20220301000314
Implantable Pulse Generator Implant Date: 20210503

## 2020-03-06 MED ORDER — DIAZEPAM 5 MG PO TABS: 5.0000 mg | ORAL_TABLET | Freq: Two times a day (BID) | ORAL | 1 refills | Status: DC | PRN

## 2020-03-06 NOTE — Telephone Encounter (Signed)
Received call from patient.   Requested refill on Diazepam 5mg .   Ok to refill??  Last office visit 02/13/2020.  Last refill 02/10/2019.

## 2020-03-07 ENCOUNTER — Ambulatory Visit: Payer: Medicare Other | Admitting: Adult Health

## 2020-03-07 ENCOUNTER — Encounter: Payer: Self-pay | Admitting: Adult Health

## 2020-03-07 VITALS — BP 129/83 | HR 77 | Ht 69.0 in | Wt 177.0 lb

## 2020-03-07 DIAGNOSIS — I639 Cerebral infarction, unspecified: Secondary | ICD-10-CM

## 2020-03-07 NOTE — Progress Notes (Signed)
I agree with the above plan 

## 2020-03-07 NOTE — Patient Instructions (Signed)
Continue clopidogrel 75 mg daily  and atorvastatin 40 mg daily for secondary stroke prevention  Loop recorder has not shown atrial fibrillation thus far - will continue to be monitored by cardiology  Continue to follow up with PCP regarding cholesterol and blood pressure management  Maintain strict control of hypertension with blood pressure goal below 130/90 and cholesterol with LDL cholesterol (bad cholesterol) goal below 70 mg/dL.    Overall stable from a stroke standpoint and recommend follow-up on an as-needed basis     Thank you for coming to see [place] at [facility]. I hope we have been able to provide you high quality care today.  You may receive a patient satisfaction survey over the next few weeks. We would appreciate your feedback and comments so that we may continue to improve ourselves and the health of our patients.

## 2020-03-07 NOTE — Progress Notes (Signed)
[facility] [address] [place]. [place] [address] [phone]       STROKE FOLLOW UP NOTE  Christian [name] Date of Birth:  10/12/1945 Medical Record Number:  [id number]   Reason for Referral: stroke follow up    SUBJECTIVE:   CHIEF COMPLAINT:  Chief Complaint  Patient presents with  . Follow-up    RM 14 alone PT is doing good, no complaints     HPI:   Today, 03/07/2020, Christian [name] returns for routine stroke follow-up unaccompanied.  Stable from stroke standpoint without new or worsening stroke/TIA symptoms He has not experienced any additional headaches which were discussed previously  Reports compliance on Plavix and atorvastatin 40 mg daily -denies side effects Blood pressure today 129/83 Loop recorder has not shown atrial fibrillation thus far  Labwork 02/13/2020: LDL 63 A1c 5.6  He does report having issues currently with a kidney stone -he does report pain in the morning upon awakening and occasionally through the day but denies hematuria - he was encouraged to f/u with his PCP  No other concerns at this time    History provided for reference purposes only Update 10/17/2019 JM: Christian [name] returns for stroke follow-up unaccompanied.  He continues to experience daily posterior headache which has been present since his stroke.  Typically dull sensation 1/10 pain scale, occasionally can increase to 3-4/10 pain scale and will take Tylenol with benefit.  Headaches are not debilitating and do not interfere with daily activity.  Denies residual dizziness or any other stroke deficits.  Denies new stroke/TIA symptoms.  Completed 3 months DAPT and remains on Plavix alone without bleeding or bruising.  Remains on atorvastatin 40 mg daily without myalgias.  Blood pressure today 121/77.  Loop recorder has not shown atrial fibrillation thus far.  No concerns at this time.  Initial visit 06/16/2019 JM: Christian [name] is being seen for hospital follow-up  accompanied by his wife.  He has been doing well since discharge with intermittent dizziness usually with overexertion but endorses ongoing improvement.  Denies residual speech or visual impairment.  He does report mild right posterior headache which has been present since his stroke with ongoing improvement.  Will occasionally take Tylenol with benefit. Continues on DAPT with aspirin 325 mg daily and clopidogrel 75 mg daily without bleeding or bruising.  Continues on atorvastatin without myalgias.  Blood pressure today 128/74.  Loop recorder has not shown atrial fibrillation thus far.  No concerns at this time.  Stroke admission 05/07/2018 Christian [name] is a 75 y.o. male with history of [facility] HLD, ASPVD, CKD 3, tobacco abuse who presented on 05/07/2018 to [facility] ED as a code stroke for dizziness, dysarthria, diplopia and left facial droop.   Stroke work-up revealed multiple bilateral strokes (right cerebellar tonsil, right cerebellum and left pons as well as left parietal lobe) embolic pattern concerning for cardioembolic source therefore loop recorder placed to evaluate for atrial fibrillation as potential etiology.  CTA showed occlusion of distal R VA at C1 level through the V4 level..  Recommended DAPT for 3 months and then Plavix alone given right VA occlusion.  No prior history of HTN BP stable during mission.  LDL 62 and continued atorvastatin 40 mg daily.  Other stroke risk factors include current tobacco use, EtOH use and family history of stroke but no personal history of stroke.  Evaluated by therapy who recommended outpatient PT/OT therapy and discharged home in stable condition.  Stroke:  Multiple bilateral strokes - embolic  pattern - concerning for cardioembolic source  CT Head - No acute abnormality. Atrophy and chronic microvascular ischemia. ASPECTS is 10  CTA H&N - Occlusion of the distal right vertebral artery at the C1 level through the V4 level. This has ill-defined margins and likely is  acute either due dissection or less likely atherosclerotic disease.   MRI head - Acute infarct in the right cerebellar tonsil, right cerebellum, and left pons. Small acute infarct in the left parietal lobe. Occlusion distal right vertebral artery which appears acute on CTA.   2D Echo EF 65-70%  LE venous Doppler no DVT  For loop recorder placement 05/09/2019  Christian [name] 2 - negative  LDL - 62  HgbA1c - 5.9  VTE prophylaxis - [place] Heparin / SCDs  aspirin 81 mg daily prior to admission, now on aspirin 325 mg daily and clopidogrel 75 mg daily.  Continue DAPT for 3 months given right VA occlusion and then Plavix alone.  Therapy recommendations: Home health OT, OP PT, no SLP -> OP therapy follow up arranged by TOC  Disposition:  Return home     ROS:   14 system review of systems performed and negative with exception of those listed in HPI  PMH:  Past Medical History:  Diagnosis Date  . Adenomatous colon polyp   . Allergy    seasonal  . CKD (chronic kidney disease) stage 3, GFR 30-59 ml/min (HCC)   . Hyperlipidemia   . Hypertriglyceridemia   . PVD (peripheral vascular disease) (HCC)    decreased TBI bilaterally  . Smoker     PSH:  Past Surgical History:  Procedure Laterality Date  . CHOLECYSTECTOMY  1987  . EYE SURGERY     lasik  . LOOP RECORDER INSERTION N/A 05/09/2019   Procedure: LOOP RECORDER INSERTION;  Surgeon: Evans Lance, MD;  Location: [place] [facility];  Service: Cardiovascular;  Laterality: N/A;    Social History:  Social History   Socioeconomic History  . Marital status: Married    Spouse name: Not on file  . Number of children: Not on file  . Years of education: Not on file  . Highest education level: Not on file  Occupational History  . Not on file  Tobacco Use  . Smoking status: Current Every Day Smoker    Packs/day: 0.25    Types: Cigarettes  . Smokeless tobacco: Never Used  Vaping Use  . Vaping Use: Never used  Substance and  Sexual Activity  . Alcohol use: Yes    Alcohol/week: 14.0 standard drinks    Types: 7 Shots of liquor, 7 Standard drinks or equivalent per week    Comment: 1 drink per day per pt  . Drug use: No  . Sexual activity: Not on file  Other Topics Concern  . Not on file  Social History Narrative  . Not on file   Social Determinants of Health   Financial Resource Strain: Not on file  Food Insecurity: Not on file  Transportation Needs: No Transportation Needs  . Lack of Transportation (Medical): No  . Lack of Transportation (Non-Medical): No  Physical Activity: Not on file  Stress: Not on file  Social Connections: Not on file  Intimate Partner Violence: Not on file    Family History:  Family History  Problem Relation Age of Onset  . Hypertension Mother   . Stroke Mother   . Diabetes Mother        borderline  . Arthritis Father   .  ALS Son   . Colon cancer Neg Hx   . Esophageal cancer Neg Hx   . Rectal cancer Neg Hx   . Stomach cancer Neg Hx     Medications:   Current Outpatient Medications on File Prior to Visit  Medication Sig Dispense Refill  . acetaminophen (TYLENOL) 325 MG tablet Take 2 tablets (650 mg total) by mouth every 4 (four) hours as needed for mild pain (or temp > 37.5 C (99.5 F)).    Marland Kitchen atorvastatin (LIPITOR) 40 MG tablet TAKE 1 TABLET BY MOUTH EVERY DAY 90 tablet 3  . cholecalciferol (VITAMIN D) 1000 UNITS tablet Take 1,000 Units by mouth daily.    . clopidogrel (PLAVIX) 75 MG tablet Take 1 tablet (75 mg total) by mouth daily. 90 tablet 3  . diazepam (VALIUM) 5 MG tablet Take 1 tablet (5 mg total) by mouth every 12 (twelve) hours as needed for anxiety. 30 tablet 1  . fluticasone (FLONASE) 50 MCG/ACT nasal spray PLACE 2 SPRAYS INTO EACH NOSTRIL DAILY 48 mL 2  . loratadine (CLARITIN) 10 MG tablet Take 10 mg by mouth daily.    . Multiple Vitamins-Minerals (MULTIVITAMIN WITH MINERALS) tablet Take 1 tablet by mouth daily.    . vitamin C (ASCORBIC ACID) 500 MG tablet  Take 500 mg by mouth daily.     No current facility-administered medications on file prior to visit.    Allergies:  No Known Allergies    OBJECTIVE:  Physical Exam  Vitals:   03/07/20 0726  BP: 129/83  Pulse: 77  Weight: 177 lb (80.3 kg)  Height: 5\' 9"  (1.753 m)   Body mass index is 26.14 kg/m. No exam data present   General: well developed, well nourished,  pleasant elderly Caucasian male, seated, in no evident distress Head: head normocephalic and atraumatic.   Neck: supple with no carotid or supraclavicular bruits Cardiovascular: regular rate and rhythm, no murmurs Musculoskeletal: no deformity Skin:  no rash/petichiae Vascular:  Normal pulses all extremities   Neurologic Exam Mental Status: Awake and fully alert. Fluent speech and language. Oriented to place and time. Recent and remote memory intact. Attention span, concentration and fund of knowledge appropriate. Mood and affect appropriate.  Cranial Nerves: Pupils equal, briskly reactive to light. Extraocular movements full without nystagmus. Visual fields full to confrontation. Hearing intact. Facial sensation intact. Face, tongue, palate moves normally and symmetrically.  Motor: Normal bulk and tone. Normal strength in all tested extremity muscles. Sensory.: intact to touch , pinprick , position and vibratory sensation.  Coordination: Rapid alternating movements normal in all extremities. Finger-to-nose and heel-to-shin performed accurately bilaterally. Gait and Station: Arises from chair without difficulty. Stance is normal. Gait demonstrates normal stride length and balance without use of assistive device.  Able to heel toe and stand on single leg alone with only mild difficulty.  Romberg negative. Reflexes: 1+ and symmetric. Toes downgoing.        ASSESSMENT: Christian [name] is a 75 y.o. year old male presented with dizziness, dysarthria, diplopia and left facial droop on 05/07/2019 with stroke work-up  revealing multiple bilateral strokes (right cerebellar tonsil, right cerebellum and left pons as well as left parietal lobe) with right VA occlusion, embolic pattern concerning for cardioembolic source.  Loop recorder placed to evaluate for atrial fibrillation as possible etiology.  Vascular risk factors include HLD, tobacco use and age.     PLAN:  1. Multiple bilateral strokes:  a. Recovered well without residual deficit b. Continue clopidogrel 75 mg  daily  and atorvastatin 40 mg daily for secondary stroke prevention c. Loop recorder has not shown atrial fibrillation thus far -monitored routinely by cardiology d. Discussed secondary stroke prevention measures and importance of close PCP follow-up for aggressive stroke risk factor management e. HLD: LDL goal<70.  Lipid panel 02/13/2020 showed LDL 63 on atorvastatin 40 mg daily per PCP    Overall stable from stroke standpoint routinely followed by PCP for aggressive stroke risk factor management therefore recommend follow-up on an as-needed basis at this time   CC:  [place] provider: Dr. Elissa Hefty, Cammie Mcgee, MD    I spent 30 minutes of face-to-face and non-face-to-face time with patient.  This included previsit chart review, lab review, study review, order entry, electronic health record documentation, patient education regarding history of prior stroke and potential etiology, review of loop recorder, secondary stroke prevention measures and importance of managing stroke risk factors and answered all other questions to patient satisfaction   Frann Rider, [facility]  [facility] [address] [place], [place] [address]  Phone [phone] Fax [phone] Note: This document was prepared with digital dictation and possible smart phrase technology. Any transcriptional errors that result from this process are unintentional.

## 2020-03-09 ENCOUNTER — Other Ambulatory Visit: Payer: Self-pay | Admitting: Family Medicine

## 2020-03-09 ENCOUNTER — Other Ambulatory Visit: Payer: Self-pay

## 2020-03-09 DIAGNOSIS — N200 Calculus of kidney: Secondary | ICD-10-CM

## 2020-03-09 MED ORDER — TAMSULOSIN HCL 0.4 MG PO CAPS
0.4000 mg | ORAL_CAPSULE | Freq: Every day | ORAL | 0 refills | Status: DC
Start: 2020-03-09 — End: 2020-04-03

## 2020-03-14 NOTE — Progress Notes (Signed)
Carelink Summary Report / Loop Recorder 

## 2020-04-03 ENCOUNTER — Other Ambulatory Visit: Payer: Self-pay | Admitting: Family Medicine

## 2020-04-04 ENCOUNTER — Ambulatory Visit: Payer: Medicare Other | Admitting: Adult Health

## 2020-04-09 ENCOUNTER — Ambulatory Visit (INDEPENDENT_AMBULATORY_CARE_PROVIDER_SITE_OTHER): Payer: Medicare Other

## 2020-04-09 DIAGNOSIS — I6340 Cerebral infarction due to embolism of unspecified cerebral artery: Secondary | ICD-10-CM | POA: Diagnosis not present

## 2020-04-11 LAB — CUP PACEART REMOTE DEVICE CHECK
Date Time Interrogation Session: 20220403000744
Implantable Pulse Generator Implant Date: 20210503

## 2020-04-19 NOTE — Progress Notes (Signed)
Carelink Summary Report / Loop Recorder 

## 2020-04-30 ENCOUNTER — Telehealth: Payer: Self-pay | Admitting: Family Medicine

## 2020-04-30 NOTE — Telephone Encounter (Signed)
Patient had a CT scan; still hasn't passed kidney stone. Please advise about what he can do to get rid of it. Please advise st [phone].

## 2020-04-30 NOTE — Telephone Encounter (Signed)
Call placed to patient to inquire.   Reports that he is having flank pain and has not noted any change. Reports that he is taking Flomax as directed.   Please advise.

## 2020-05-01 ENCOUNTER — Other Ambulatory Visit: Payer: Self-pay

## 2020-05-01 ENCOUNTER — Other Ambulatory Visit: Payer: Self-pay | Admitting: Family Medicine

## 2020-05-01 ENCOUNTER — Ambulatory Visit
Admission: RE | Admit: 2020-05-01 | Discharge: 2020-05-01 | Disposition: A | Discharge status: Home / Self Care | Payer: Medicare Other | Source: Ambulatory Visit | Attending: Family Medicine | Admitting: Family Medicine

## 2020-05-01 DIAGNOSIS — D7389 Other diseases of spleen: Secondary | ICD-10-CM | POA: Diagnosis not present

## 2020-05-01 DIAGNOSIS — N200 Calculus of kidney: Secondary | ICD-10-CM | POA: Diagnosis not present

## 2020-05-01 DIAGNOSIS — K4090 Unilateral inguinal hernia, without obstruction or gangrene, not specified as recurrent: Secondary | ICD-10-CM | POA: Diagnosis not present

## 2020-05-01 DIAGNOSIS — K429 Umbilical hernia without obstruction or gangrene: Secondary | ICD-10-CM | POA: Diagnosis not present

## 2020-05-01 NOTE — Telephone Encounter (Signed)
CT scan scheduled and patient aware.   Agreeable to plan.

## 2020-05-01 NOTE — Telephone Encounter (Signed)
If he is having pain, the stone is trying to pass.  It was so large I do not feel that it will be able to pass on its own.  Therefore he will need to see urology to discuss surgical extraction either through stent or lithotripsy.  Recommend obtaining urology consult as well as CT renal stone protocol to determine the size and extent of the stone burden.

## 2020-05-03 ENCOUNTER — Ambulatory Visit: Payer: Self-pay | Admitting: Urology

## 2020-05-03 ENCOUNTER — Other Ambulatory Visit: Payer: Self-pay | Admitting: *Deleted

## 2020-05-03 MED ORDER — TAMSULOSIN HCL 0.4 MG PO CAPS: 0.4000 mg | ORAL_CAPSULE | Freq: Every day | ORAL | 0 refills | Status: DC

## 2020-05-07 ENCOUNTER — Ambulatory Visit (INDEPENDENT_AMBULATORY_CARE_PROVIDER_SITE_OTHER): Payer: Medicare Other | Admitting: Urology

## 2020-05-07 ENCOUNTER — Encounter: Payer: Self-pay | Admitting: Urology

## 2020-05-07 ENCOUNTER — Other Ambulatory Visit: Payer: Self-pay

## 2020-05-07 VITALS — BP 144/76 | HR 76 | Temp 97.7°F | Ht 69.0 in | Wt 170.0 lb

## 2020-05-07 DIAGNOSIS — N200 Calculus of kidney: Secondary | ICD-10-CM | POA: Diagnosis not present

## 2020-05-07 LAB — URINALYSIS, ROUTINE W REFLEX MICROSCOPIC
Bilirubin, UA: NEGATIVE
Glucose, UA: NEGATIVE
Ketones, UA: NEGATIVE
Nitrite, UA: NEGATIVE
Protein,UA: NEGATIVE
RBC, UA: NEGATIVE
Specific Gravity, UA: 1.01 (ref 1.005–1.030)
Urobilinogen, Ur: 0.2 mg/dL (ref 0.2–1.0)
pH, UA: 6.5 (ref 5.0–7.5)

## 2020-05-07 LAB — MICROSCOPIC EXAMINATION
Bacteria, UA: NONE SEEN
Epithelial Cells (non renal): NONE SEEN /hpf (ref 0–10)
Renal Epithel, UA: NONE SEEN /hpf

## 2020-05-07 NOTE — Patient Instructions (Signed)

## 2020-05-07 NOTE — Progress Notes (Signed)
Urological Symptom Review  Patient is experiencing the following symptoms: Kidney stones   Review of Systems  Gastrointestinal (upper)  : Negative for upper GI symptoms  Gastrointestinal (lower) : Negative for lower GI symptoms  Constitutional : Negative for symptoms  Skin: Negative for skin symptoms  Eyes: Negative for eye symptoms  Ear/Nose/Throat : Negative for Ear/Nose/Throat symptoms  Hematologic/Lymphatic: Negative for Hematologic/Lymphatic symptoms  Cardiovascular : Negative for cardiovascular symptoms  Respiratory : Negative for respiratory symptoms  Endocrine: Negative for endocrine symptoms  Musculoskeletal: Negative for musculoskeletal symptoms  Neurological: Negative for neurological symptoms  Psychologic: Negative for psychiatric symptoms

## 2020-05-07 NOTE — Progress Notes (Signed)
Marland Kitchen   05/07/2020 1:19 PM   ARMANII PRESSNELL December 28, 1945 [id number]  Referring provider: Susy Frizzle, MD [address] [place],  [place] [address]  CC: kidney stone   HPI: New pt for -  1) renal stone - pt underwent chest CT for emphysema/lung ca screening and a left renal stone was incompletely visualized. He underwent CT A/P 05/01/2020 with a 15x15x9 mm left mid-lower pole stone ([id number], ssd 13 cm, visible) was noted along with 6 mm LLP stone and two 3 mm LUP stones.  He has had some left flank pain. No prior h/o stones. No hematuria.   He has a h/o CVA and on plavix. He has a loop recorder. He is on tamsulosin. He was discharged from cardiology and neurology.     PMH: Past Medical History:  Diagnosis Date  . Adenomatous colon polyp   . Allergy    seasonal  . CKD (chronic kidney disease) stage 3, GFR 30-59 ml/min (HCC)   . Hyperlipidemia   . Hypertriglyceridemia   . PVD (peripheral vascular disease) (HCC)    decreased TBI bilaterally  . Smoker     Surgical History: Past Surgical History:  Procedure Laterality Date  . CHOLECYSTECTOMY  1987  . EYE SURGERY     lasik  . LOOP RECORDER INSERTION N/A 05/09/2019   Procedure: LOOP RECORDER INSERTION;  Surgeon: Evans Lance, MD;  Location: [facility];  Service: Cardiovascular;  Laterality: N/A;    Home Medications:  Allergies as of 05/07/2020   No Known Allergies     Medication List       Accurate as of May 07, 2020  1:19 PM. If you have any questions, ask your nurse or doctor.        acetaminophen 325 MG tablet Commonly known as: TYLENOL Take 2 tablets (650 mg total) by mouth every 4 (four) hours as needed for mild pain (or temp > 37.5 C (99.5 F)).   atorvastatin 40 MG tablet Commonly known as: LIPITOR TAKE 1 TABLET BY MOUTH EVERY DAY   cholecalciferol 1000 units tablet Commonly known as: VITAMIN D Take 1,000 Units by mouth daily.   clopidogrel 75 MG tablet Commonly known as: PLAVIX Take 1  tablet (75 mg total) by mouth daily.   diazepam 5 MG tablet Commonly known as: VALIUM Take 1 tablet (5 mg total) by mouth every 12 (twelve) hours as needed for anxiety.   fluticasone 50 MCG/ACT nasal spray Commonly known as: FLONASE PLACE 2 SPRAYS INTO EACH NOSTRIL DAILY   loratadine 10 MG tablet Commonly known as: CLARITIN Take 10 mg by mouth daily.   multivitamin with minerals tablet Take 1 tablet by mouth daily.   tamsulosin 0.4 MG Caps capsule Commonly known as: FLOMAX Take 1 capsule (0.4 mg total) by mouth daily. To help pass kidney stone   vitamin C 500 MG tablet Commonly known as: ASCORBIC ACID Take 500 mg by mouth daily.       Allergies: No Known Allergies  Family History: Family History  Problem Relation Age of Onset  . Hypertension Mother   . Stroke Mother   . Diabetes Mother        borderline  . Arthritis Father   . ALS Son   . Colon cancer Neg Hx   . Esophageal cancer Neg Hx   . Rectal cancer Neg Hx   . Stomach cancer Neg Hx     Social History:  reports that he has been smoking  cigarettes. He has a 25.00 pack-year smoking history. He has never used smokeless tobacco. He reports current alcohol use of about 14.0 standard drinks of alcohol per week. He reports that he does not use drugs.   Physical Exam: BP (!) 144/76   Pulse 76   Temp 97.7 F (36.5 C)   Ht 5\' 9"  (1.753 m)   Wt 170 lb (77.1 kg)   BMI 25.10 kg/m   Constitutional:  Alert and oriented, No acute distress. HEENT: [place] AT, moist mucus membranes.  Trachea midline, no masses. Cardiovascular: No clubbing, cyanosis, or edema. Respiratory: Normal respiratory effort, no increased work of breathing. GI: Abdomen is soft, nontender, nondistended, no abdominal masses Skin: No rashes, bruises or suspicious lesions. Neurologic: Grossly intact, no focal deficits, moving all 4 extremities. Psychiatric: Normal mood and affect.  Laboratory Data: Lab Results  Component Value Date   WBC 8.4  02/13/2020   HGB 16.0 02/13/2020   HCT 46.9 02/13/2020   MCV 89.2 02/13/2020   PLT 283 02/13/2020    Lab Results  Component Value Date   CREATININE 1.31 (H) 02/13/2020    Lab Results  Component Value Date   PSA 0.3 08/09/2019   PSA 0.3 08/06/2018   PSA 0.4 07/30/2017    No results found for: TESTOSTERONE  Lab Results  Component Value Date   HGBA1C 5.6 02/13/2020    Urinalysis    Component Value Date/Time   COLORURINE STRAW (A) 05/08/2019 2151   APPEARANCEUR CLEAR 05/08/2019 2151   LABSPEC 1.009 05/08/2019 2151   PHURINE 7.0 05/08/2019 2151   GLUCOSEU NEGATIVE 05/08/2019 2151   HGBUR NEGATIVE 05/08/2019 2151   BILIRUBINUR NEGATIVE 05/08/2019 2151   [place] NEGATIVE 05/08/2019 2151   PROTEINUR NEGATIVE 05/08/2019 2151   NITRITE NEGATIVE 05/08/2019 2151   LEUKOCYTESUR TRACE (A) 05/08/2019 2151    Lab Results  Component Value Date   BACTERIA NONE SEEN 05/08/2019    Pertinent Imaging: Ct images reviewed 2022 and [place] 2020    Results for orders placed during the hospital encounter of 08/16/18  US Renal  Narrative CLINICAL DATA:  Chronic kidney disease  EXAM: RENAL / URINARY TRACT ULTRASOUND COMPLETE  COMPARISON:  08/13/2012  FINDINGS: Right Kidney:  Renal measurements: 9.4 x 4.8 x 5.0 cm = volume: 118 mL . Echogenicity within normal limits. No mass or hydronephrosis visualized.  Left Kidney:  Renal measurements: 9.3 x 6.2 x 4.3 cm = volume: 129 mL. Echogenicity within normal limits. 10 x 12 x 10 mm upper pole simple cyst. No hydronephrosis.  Bladder:  Within normal limits.  IMPRESSION: 12 mm left upper pole simple cyst, benign.  No hydronephrosis.   Electronically Signed By: Julian Hy M.D. On: 08/17/2018 00:44  No results found for this or any previous visit.  No results found for this or any previous visit.  Results for orders placed during the hospital encounter of 05/01/20  CT RENAL STONE STUDY  Narrative CLINICAL  DATA:  Follow-up kidney stones  EXAM: CT ABDOMEN AND PELVIS WITHOUT CONTRAST  TECHNIQUE: Multidetector CT imaging of the abdomen and pelvis was performed following the standard protocol without IV contrast.  COMPARISON:  Partial comparison to CT chest dated 02/28/2020. Renal ultrasound dated 08/16/2018.  FINDINGS: Lower chest: Lung bases are clear.  Hepatobiliary: Unenhanced liver is unremarkable.  Status post cholecystectomy. No intrahepatic or extrahepatic ductal dilatation.  Pancreas: Within normal limits.  Spleen: Calcified splenic granulomata.  Adrenals/Urinary Tract: Adrenal glands are within normal limits.  Right kidney is within normal  limits.  Two nonobstructing left upper pole renal calculi measuring 3 mm (series 2/image 27). Three nonobstructing left lower pole renal calculi measuring up to 5 mm (coronal image 65).  Additional 15 mm calculus in the left lower pole renal collecting system (series 2/image 34) with mild fullness of the collecting system and surrounding inflammatory changes.  No ureteral or bladder calculi.  Bladder is within normal limits.  Stomach/Bowel: Stomach is within normal limits.  No evidence of bowel obstruction.  Normal appendix (series 2/image 32).  Left colonic diverticulosis, without evidence of diverticulitis.  Vascular/Lymphatic: No evidence of abdominal aortic aneurysm.  Atherosclerotic calcifications of the abdominal aorta and branch vessels.  No suspicious abdominopelvic lymphadenopathy.  Reproductive: Prostate is unremarkable.  Other: No abdominopelvic ascites.  Tiny fat containing left inguinal hernia (series 2/image 74). Tiny fat containing periumbilical hernia (series 2/image 49).  Musculoskeletal: No focal osseous lesions.  IMPRESSION: 15 mm calculus in the left lower pole collecting system with mild fullness and surrounding inflammatory changes.  Additional nonobstructing left renal calculi measuring  up to 5 mm.  Additional ancillary findings as above.   Electronically Signed By: Julian Hy M.D. On: 05/01/2020 15:31   Assessment & Plan:    1. Nephrolithiasis -I drew Richar a picture of the anatomy.  We went over the nature risks and benefits of continued surveillance, ureteroscopy, shockwave lithotripsy as well as PCNL.  All questions answered.  Given the stone size, characteristics, number of stones and his comorbidities and use of Plavix we will proceed with cystoscopy, left retrograde pyelogram, left ureteroscopy with laser lithotripsy and stent placement.  I discussed he may need a stent and a staged procedure.  All questions answered.  He elects to proceed.  - Urinalysis, Routine w reflex microscopic   No follow-ups on file.  Festus Aloe, MD

## 2020-05-11 LAB — CUP PACEART REMOTE DEVICE CHECK
Date Time Interrogation Session: 20220506001910
Implantable Pulse Generator Implant Date: 20210503

## 2020-05-14 ENCOUNTER — Ambulatory Visit (INDEPENDENT_AMBULATORY_CARE_PROVIDER_SITE_OTHER): Payer: Medicare Other

## 2020-05-14 DIAGNOSIS — I639 Cerebral infarction, unspecified: Secondary | ICD-10-CM

## 2020-05-15 ENCOUNTER — Other Ambulatory Visit: Payer: Self-pay | Admitting: Urology

## 2020-05-26 ENCOUNTER — Other Ambulatory Visit: Payer: Self-pay | Admitting: Family Medicine

## 2020-05-28 NOTE — Patient Instructions (Signed)
DUE TO COVID-19 ONLY ONE VISITOR IS ALLOWED TO COME WITH YOU AND STAY IN THE WAITING ROOM ONLY DURING PRE OP AND PROCEDURE DAY OF SURGERY. THE 1 VISITOR  MAY VISIT WITH YOU AFTER SURGERY IN YOUR PRIVATE ROOM DURING VISITING HOURS ONLY!              Cristino Martes   Your procedure is scheduled on: 06/08/20   Report to [facility] Main  Entrance   Report to admitting at: 7:00 AM     Call this number if you have problems the morning of surgery [phone]    Remember: Do not eat solid food :After Midnight. Clear liquids until: 6:00 am.  CLEAR LIQUID DIET  Foods Allowed                                                                     Foods Excluded  Coffee and tea, regular and decaf                             liquids that you cannot  Plain Jell-O any favor except red or purple                                           see through such as: Fruit ices (not with fruit pulp)                                     milk, soups, orange juice  Iced Popsicles                                    All solid food Carbonated beverages, regular and diet                                    Cranberry, grape and apple juices Sports drinks like Gatorade Lightly seasoned clear broth or consume(fat free) Sugar, honey syrup  Sample Menu Breakfast                                Lunch                                     Supper Cranberry juice                    Beef broth                            Chicken broth Jell-O                                     Grape juice  Apple juice Coffee or tea                        Jell-O                                      Popsicle                                                Coffee or tea                        Coffee or tea  _____________________________________________________________________  BRUSH YOUR TEETH MORNING OF SURGERY AND RINSE YOUR MOUTH OUT, NO CHEWING GUM CANDY OR MINTS.    Take these medicines the morning of surgery  with A SIP OF WATER: Flomax,loratadine.Use Flonase as usual.Diazepam as needed.                             You may not have any metal on your body including hair pins and              piercings  Do not wear jewelry, lotions, powders or perfumes, deodorant             Men may shave face and neck.   Do not bring valuables to the hospital. [name].  Contacts, dentures or bridgework may not be worn into surgery.  Leave suitcase in the car. After surgery it may be brought to your room.     Patients discharged the day of surgery will not be allowed to drive home. IF YOU ARE HAVING SURGERY AND GOING HOME THE SAME DAY, YOU MUST HAVE AN ADULT TO DRIVE YOU HOME AND BE WITH YOU FOR 24 HOURS. YOU MAY GO HOME BY TAXI OR UBER OR ORTHERWISE, BUT AN ADULT MUST ACCOMPANY YOU HOME AND STAY WITH YOU FOR 24 HOURS.  Name and phone number of your driver:  Special Instructions: N/A              Please read over the following fact sheets you were given: _____________________________________________________________________         [facility] - Preparing for Surgery Before surgery, you can play an important role.  Because skin is not sterile, your skin needs to be as free of germs as possible.  You can reduce the number of germs on your skin by washing with CHG (chlorahexidine gluconate) soap before surgery.  CHG is an antiseptic cleaner which kills germs and bonds with the skin to continue killing germs even after washing. Please DO NOT use if you have an allergy to CHG or antibacterial soaps.  If your skin becomes reddened/irritated stop using the CHG and inform your nurse when you arrive at Short Stay. Do not shave (including legs and underarms) for at least 48 hours prior to the first CHG shower.  You may shave your face/neck. Please follow these instructions carefully:  1.  Shower with CHG Soap the night before surgery and the  morning of Surgery.  2.  If you  choose to wash your hair, wash  your hair first as usual with your  normal  shampoo.  3.  After you shampoo, rinse your hair and body thoroughly to remove the  shampoo.                           4.  Use CHG as you would any other liquid soap.  You can apply chg directly  to the skin and wash                       Gently with a scrungie or clean washcloth.  5.  Apply the CHG Soap to your body ONLY FROM THE NECK DOWN.   Do not use on face/ open                           Wound or open sores. Avoid contact with eyes, ears mouth and genitals (private parts).                       Wash face,  Genitals (private parts) with your normal soap.             6.  Wash thoroughly, paying special attention to the area where your surgery  will be performed.  7.  Thoroughly rinse your body with warm water from the neck down.  8.  DO NOT shower/wash with your normal soap after using and rinsing off  the CHG Soap.                9.  Pat yourself dry with a clean towel.            10.  Wear clean pajamas.            11.  Place clean sheets on your bed the night of your first shower and do not  sleep with pets. Day of Surgery : Do not apply any lotions/deodorants the morning of surgery.  Please wear clean clothes to the hospital/surgery center.  FAILURE TO FOLLOW THESE INSTRUCTIONS MAY RESULT IN THE CANCELLATION OF YOUR SURGERY PATIENT SIGNATURE_________________________________  NURSE SIGNATURE__________________________________  ________________________________________________________________________

## 2020-05-30 ENCOUNTER — Other Ambulatory Visit: Payer: Self-pay

## 2020-05-30 ENCOUNTER — Encounter (HOSPITAL_COMMUNITY)
Admission: RE | Admit: 2020-05-30 | Discharge: 2020-05-30 | Disposition: A | Discharge status: Home / Self Care | Payer: Medicare Other | Source: Ambulatory Visit | Attending: Urology | Admitting: Urology

## 2020-05-30 ENCOUNTER — Encounter (HOSPITAL_COMMUNITY): Payer: Self-pay

## 2020-05-30 DIAGNOSIS — Z01818 Encounter for other preprocedural examination: Secondary | ICD-10-CM | POA: Insufficient documentation

## 2020-05-30 HISTORY — DX: Anxiety disorder, unspecified: F41.9

## 2020-05-30 HISTORY — DX: Cerebral infarction, unspecified: I63.9

## 2020-05-30 LAB — CBC
HCT: 45.4 % (ref 39.0–52.0)
Hemoglobin: 15.1 g/dL (ref 13.0–17.0)
MCH: 30.2 pg (ref 26.0–34.0)
MCHC: 33.3 g/dL (ref 30.0–36.0)
MCV: 90.8 fL (ref 80.0–100.0)
Platelets: 249 10*3/uL (ref 150–400)
RBC: 5 MIL/uL (ref 4.22–5.81)
RDW: 13.2 % (ref 11.5–15.5)
WBC: 7.4 10*3/uL (ref 4.0–10.5)
nRBC: 0 % (ref 0.0–0.2)

## 2020-05-30 LAB — BASIC METABOLIC PANEL
Anion gap: 6 (ref 5–15)
BUN: 13 mg/dL (ref 8–23)
CO2: 28 mmol/L (ref 22–32)
Calcium: 9.3 mg/dL (ref 8.9–10.3)
Chloride: 105 mmol/L (ref 98–111)
Creatinine, Ser: 1.3 mg/dL — ABNORMAL HIGH (ref 0.61–1.24)
GFR, Estimated: 57 mL/min — ABNORMAL LOW (ref >60)
Glucose, Bld: 129 mg/dL — ABNORMAL HIGH (ref 70–99)
Potassium: 4.2 mmol/L (ref 3.5–5.1)
Sodium: 139 mmol/L (ref 135–145)

## 2020-05-30 NOTE — Progress Notes (Signed)
COVID Vaccine Completed: Yes Date COVID Vaccine completed: 05/08/20 2nd. boaster COVID vaccine manufacturer: Pfizer      PCP -  Dr. Jenna Luo Cardiologist - Dr. Cristopher Peru. LOV: 05/10/19  Chest x-ray - CT chest: 05/01/20 EKG -  Stress Test -  ECHO -  Cardiac Cath -  Pacemaker/ICD device last checked:  Sleep Study -  CPAP -   Fasting Blood Sugar -  Checks Blood Sugar _____ times a day  Blood Thinner Instructions:RN advised pt. To check with MD. About Plavix instructions. Aspirin Instructions: Last Dose:  Anesthesia review: Hx: CKD(III),smoker,loop recorder,PVD,cryptogenic stroke.  Patient denies shortness of breath, fever, cough and chest pain at PAT appointment   Patient verbalized understanding of instructions that were given to them at the PAT appointment. Patient was also instructed that they will need to review over the PAT instructions again at home before surgery.

## 2020-05-31 NOTE — Progress Notes (Signed)
Carelink Summary Report / Loop Recorder 

## 2020-06-07 NOTE — Anesthesia Preprocedure Evaluation (Addendum)
Anesthesia Evaluation  Patient identified by MRN, date of birth, ID band Patient awake    Reviewed: Allergy & Precautions, H&P , NPO status , Patient's Chart, lab work & pertinent test results  Airway Mallampati: II  TM Distance: >3 FB Neck ROM: Full    Dental no notable dental hx. (+) Teeth Intact, Caps, Dental Advisory Given, Poor Dentition   Pulmonary neg pulmonary ROS, Current Smoker and Patient abstained from smoking.,    Pulmonary exam normal breath sounds clear to auscultation       Cardiovascular Exercise Tolerance: Good + Peripheral Vascular Disease  Normal cardiovascular exam Rhythm:Regular Rate:Normal  Echo 5/21 1. Hyperdynamic LV systolic function; mild LVH; grade 1 diastolic  dysfunction.  2. Left ventricular ejection fraction, by estimation, is 65 to 70%. The  left ventricle has normal function. The left ventricle has no regional  wall motion abnormalities. There is mild left ventricular hypertrophy.  Left ventricular diastolic parameters  are consistent with Grade I diastolic dysfunction (impaired relaxation).    Neuro/Psych Anxiety TIANo Residual Symptoms negative psych ROS   GI/Hepatic negative GI ROS, Neg liver ROS,   Endo/Other  negative endocrine ROS  Renal/GU CRFRenal diseaseCr 1.3  negative genitourinary   Musculoskeletal negative musculoskeletal ROS (+)   Abdominal   Peds negative pediatric ROS (+)  Hematology negative hematology ROS (+)   Anesthesia Other Findings   Reproductive/Obstetrics negative OB ROS                         Anesthesia Physical Anesthesia Plan  ASA: III  Anesthesia Plan: General   Post-op Pain Management:    Induction: Intravenous  PONV Risk Score and Plan: 2 and Ondansetron and Dexamethasone  Airway Management Planned: LMA and Oral ETT  Additional Equipment: None  Intra-op Plan:   Post-operative Plan: Extubation in  OR  Informed Consent: I have reviewed the patients History and Physical, chart, labs and discussed the procedure including the risks, benefits and alternatives for the proposed anesthesia with the patient or authorized representative who has indicated his/her understanding and acceptance.     Dental advisory given  Plan Discussed with: Anesthesiologist and CRNA  Anesthesia Plan Comments:       Anesthesia Quick Evaluation

## 2020-06-08 ENCOUNTER — Encounter (HOSPITAL_COMMUNITY): Payer: Self-pay | Admitting: Urology

## 2020-06-08 ENCOUNTER — Ambulatory Visit (HOSPITAL_COMMUNITY): Payer: Medicare Other | Admitting: Physician Assistant

## 2020-06-08 ENCOUNTER — Ambulatory Visit (HOSPITAL_COMMUNITY): Payer: Medicare Other | Admitting: Anesthesiology

## 2020-06-08 ENCOUNTER — Ambulatory Visit (HOSPITAL_COMMUNITY): Payer: Medicare Other

## 2020-06-08 ENCOUNTER — Encounter (HOSPITAL_COMMUNITY)
Admission: RE | Disposition: A | Discharge status: Home / Self Care | Payer: Self-pay | Source: Ambulatory Visit | Attending: Urology

## 2020-06-08 ENCOUNTER — Ambulatory Visit (HOSPITAL_COMMUNITY)
Admission: RE | Admit: 2020-06-08 | Discharge: 2020-06-08 | Disposition: A | Discharge status: Home / Self Care | Payer: Medicare Other | Source: Ambulatory Visit | Attending: Urology | Admitting: Urology

## 2020-06-08 DIAGNOSIS — N200 Calculus of kidney: Secondary | ICD-10-CM | POA: Insufficient documentation

## 2020-06-08 DIAGNOSIS — Z7902 Long term (current) use of antithrombotics/antiplatelets: Secondary | ICD-10-CM | POA: Insufficient documentation

## 2020-06-08 DIAGNOSIS — Z833 Family history of diabetes mellitus: Secondary | ICD-10-CM | POA: Diagnosis not present

## 2020-06-08 DIAGNOSIS — Z79899 Other long term (current) drug therapy: Secondary | ICD-10-CM | POA: Diagnosis not present

## 2020-06-08 DIAGNOSIS — Z823 Family history of stroke: Secondary | ICD-10-CM | POA: Insufficient documentation

## 2020-06-08 DIAGNOSIS — N183 Chronic kidney disease, stage 3 unspecified: Secondary | ICD-10-CM | POA: Diagnosis not present

## 2020-06-08 DIAGNOSIS — Z8261 Family history of arthritis: Secondary | ICD-10-CM | POA: Insufficient documentation

## 2020-06-08 DIAGNOSIS — Z7901 Long term (current) use of anticoagulants: Secondary | ICD-10-CM | POA: Insufficient documentation

## 2020-06-08 DIAGNOSIS — Z8673 Personal history of transient ischemic attack (TIA), and cerebral infarction without residual deficits: Secondary | ICD-10-CM | POA: Diagnosis not present

## 2020-06-08 DIAGNOSIS — F17210 Nicotine dependence, cigarettes, uncomplicated: Secondary | ICD-10-CM | POA: Diagnosis not present

## 2020-06-08 DIAGNOSIS — Z8249 Family history of ischemic heart disease and other diseases of the circulatory system: Secondary | ICD-10-CM | POA: Diagnosis not present

## 2020-06-08 DIAGNOSIS — E785 Hyperlipidemia, unspecified: Secondary | ICD-10-CM | POA: Diagnosis not present

## 2020-06-08 DIAGNOSIS — Z820 Family history of epilepsy and other diseases of the nervous system: Secondary | ICD-10-CM | POA: Diagnosis not present

## 2020-06-08 HISTORY — PX: CYSTOSCOPY/URETEROSCOPY/HOLMIUM LASER/STENT PLACEMENT: SHX6546

## 2020-06-08 SURGERY — CYSTOSCOPY/URETEROSCOPY/HOLMIUM LASER/STENT PLACEMENT
Anesthesia: General | Laterality: Left

## 2020-06-08 MED ORDER — GLYCOPYRROLATE PF 0.2 MG/ML IJ SOSY
PREFILLED_SYRINGE | INTRAMUSCULAR | Status: AC
  Filled 2020-06-08: qty 1

## 2020-06-08 MED ORDER — CEFAZOLIN SODIUM-DEXTROSE 2-4 GM/100ML-% IV SOLN
2.0000 g | Freq: Once | INTRAVENOUS | Status: AC
  Administered 2020-06-08: 2 g via INTRAVENOUS
  Filled 2020-06-08: qty 100

## 2020-06-08 MED ORDER — PROPOFOL 10 MG/ML IV BOLUS
INTRAVENOUS | Status: DC | PRN
  Administered 2020-06-08: 50 mg via INTRAVENOUS
  Administered 2020-06-08: 100 mg via INTRAVENOUS

## 2020-06-08 MED ORDER — LIDOCAINE HCL URETHRAL/MUCOSAL 2 % EX GEL
CUTANEOUS | Status: DC | PRN
  Administered 2020-06-08: 1 via URETHRAL

## 2020-06-08 MED ORDER — LIDOCAINE HCL URETHRAL/MUCOSAL 2 % EX GEL
CUTANEOUS | Status: AC
  Filled 2020-06-08: qty 30

## 2020-06-08 MED ORDER — CHLORHEXIDINE GLUCONATE 0.12 % MT SOLN
15.0000 mL | Freq: Once | OROMUCOSAL | Status: AC
  Administered 2020-06-08: 15 mL via OROMUCOSAL

## 2020-06-08 MED ORDER — ACETAMINOPHEN 160 MG/5ML PO SOLN: 325.0000 mg | ORAL | Status: DC | PRN

## 2020-06-08 MED ORDER — ORAL CARE MOUTH RINSE: 15.0000 mL | Freq: Once | OROMUCOSAL | Status: AC

## 2020-06-08 MED ORDER — MEPERIDINE HCL 50 MG/ML IJ SOLN: 6.2500 mg | INTRAMUSCULAR | Status: DC | PRN

## 2020-06-08 MED ORDER — FENTANYL CITRATE (PF) 100 MCG/2ML IJ SOLN
INTRAMUSCULAR | Status: AC
  Filled 2020-06-08: qty 2

## 2020-06-08 MED ORDER — FENTANYL CITRATE (PF) 100 MCG/2ML IJ SOLN: 25.0000 ug | INTRAMUSCULAR | Status: DC | PRN

## 2020-06-08 MED ORDER — EPHEDRINE SULFATE 50 MG/ML IJ SOLN
INTRAMUSCULAR | Status: DC | PRN
  Administered 2020-06-08 (×2): 10 mg via INTRAVENOUS
  Administered 2020-06-08: 5 mg via INTRAVENOUS
  Administered 2020-06-08: 15 mg via INTRAVENOUS
  Administered 2020-06-08: 10 mg via INTRAVENOUS

## 2020-06-08 MED ORDER — GLYCOPYRROLATE 0.2 MG/ML IJ SOLN
INTRAMUSCULAR | Status: DC | PRN
  Administered 2020-06-08: .2 mg via INTRAVENOUS

## 2020-06-08 MED ORDER — LACTATED RINGERS IV SOLN: INTRAVENOUS | Status: DC

## 2020-06-08 MED ORDER — LIDOCAINE 2% (20 MG/ML) 5 ML SYRINGE
INTRAMUSCULAR | Status: AC
  Filled 2020-06-08: qty 5

## 2020-06-08 MED ORDER — ONDANSETRON HCL 4 MG/2ML IJ SOLN
INTRAMUSCULAR | Status: AC
  Filled 2020-06-08: qty 2

## 2020-06-08 MED ORDER — ONDANSETRON HCL 4 MG/2ML IJ SOLN: 4.0000 mg | Freq: Once | INTRAMUSCULAR | Status: DC | PRN

## 2020-06-08 MED ORDER — EPHEDRINE 5 MG/ML INJ
INTRAVENOUS | Status: AC
  Filled 2020-06-08: qty 10

## 2020-06-08 MED ORDER — NITROFURANTOIN MONOHYD MACRO 100 MG PO CAPS: 100.0000 mg | ORAL_CAPSULE | Freq: Every day | ORAL | 0 refills | Status: AC

## 2020-06-08 MED ORDER — ONDANSETRON HCL 4 MG/2ML IJ SOLN
INTRAMUSCULAR | Status: DC | PRN
  Administered 2020-06-08: 4 mg via INTRAVENOUS

## 2020-06-08 MED ORDER — OXYCODONE HCL 5 MG PO TABS
5.0000 mg | ORAL_TABLET | Freq: Once | ORAL | Status: DC | PRN
Start: 2020-06-08 — End: 2020-06-08

## 2020-06-08 MED ORDER — OXYCODONE HCL 5 MG/5ML PO SOLN: 5.0000 mg | Freq: Once | ORAL | Status: DC | PRN

## 2020-06-08 MED ORDER — DEXAMETHASONE SODIUM PHOSPHATE 4 MG/ML IJ SOLN
INTRAMUSCULAR | Status: DC | PRN
  Administered 2020-06-08: 5 mg via INTRAVENOUS

## 2020-06-08 MED ORDER — ACETAMINOPHEN 325 MG PO TABS: 325.0000 mg | ORAL_TABLET | ORAL | Status: DC | PRN

## 2020-06-08 MED ORDER — PHENYLEPHRINE HCL (PRESSORS) 10 MG/ML IV SOLN
INTRAVENOUS | Status: DC | PRN
  Administered 2020-06-08 (×5): 80 ug via INTRAVENOUS

## 2020-06-08 MED ORDER — IOHEXOL 300 MG/ML  SOLN
INTRAMUSCULAR | Status: DC | PRN
  Administered 2020-06-08: 10 mL

## 2020-06-08 MED ORDER — PROPOFOL 10 MG/ML IV BOLUS
INTRAVENOUS | Status: AC
  Filled 2020-06-08: qty 40

## 2020-06-08 MED ORDER — FENTANYL CITRATE (PF) 100 MCG/2ML IJ SOLN
INTRAMUSCULAR | Status: DC | PRN
  Administered 2020-06-08: 50 ug via INTRAVENOUS
  Administered 2020-06-08 (×2): 25 ug via INTRAVENOUS
  Administered 2020-06-08: 50 ug via INTRAVENOUS
  Administered 2020-06-08 (×2): 25 ug via INTRAVENOUS

## 2020-06-08 MED ORDER — PHENYLEPHRINE HCL (PRESSORS) 10 MG/ML IV SOLN
INTRAVENOUS | Status: AC
  Filled 2020-06-08: qty 1

## 2020-06-08 MED ORDER — LIDOCAINE HCL (CARDIAC) PF 100 MG/5ML IV SOSY
PREFILLED_SYRINGE | INTRAVENOUS | Status: DC | PRN
  Administered 2020-06-08: 60 mg via INTRAVENOUS

## 2020-06-08 MED ORDER — SODIUM CHLORIDE 0.9 % IR SOLN
Status: DC | PRN
  Administered 2020-06-08: 6000 mL via INTRAVESICAL

## 2020-06-08 SURGICAL SUPPLY — 23 items
BAG URO CATCHER STRL LF (MISCELLANEOUS) ×2 IMPLANT
BASKET ZERO TIP NITINOL 2.4FR (BASKET) IMPLANT
CATH INTERMIT  6FR 70CM (CATHETERS) ×4 IMPLANT
CATH URET 5FR 28IN CONE TIP (BALLOONS)
CATH URET 5FR 70CM CONE TIP (BALLOONS) IMPLANT
CLOTH BEACON ORANGE TIMEOUT ST (SAFETY) ×2 IMPLANT
EXTRACTOR STONE NITINOL NGAGE (UROLOGICAL SUPPLIES) ×2 IMPLANT
GLOVE SURG ENC MOIS LTX SZ7.5 (GLOVE) ×2 IMPLANT
GOWN STRL REUS W/TWL XL LVL3 (GOWN DISPOSABLE) ×2 IMPLANT
GUIDEWIRE ANG ZIPWIRE 038X150 (WIRE) ×2 IMPLANT
GUIDEWIRE STR DUAL SENSOR (WIRE) ×2 IMPLANT
KIT TURNOVER KIT A (KITS) ×2 IMPLANT
LASER FIB FLEXIVA PULSE ID 365 (Laser) IMPLANT
MANIFOLD NEPTUNE II (INSTRUMENTS) ×2 IMPLANT
PACK CYSTO (CUSTOM PROCEDURE TRAY) ×2 IMPLANT
SHEATH URETERAL 12FRX28CM (UROLOGICAL SUPPLIES) IMPLANT
SHEATH URETERAL 12FRX35CM (MISCELLANEOUS) ×2 IMPLANT
STENT URET 6FRX26 CONTOUR (STENTS) ×2 IMPLANT
TRACTIP FLEXIVA PULS ID 200XHI (Laser) IMPLANT
TRACTIP FLEXIVA PULSE ID 200 (Laser) ×2 IMPLANT
TRAY FOLEY MTR SLVR 16FR STAT (SET/KITS/TRAYS/PACK) ×2 IMPLANT
TUBING CONNECTING 10 (TUBING) ×2 IMPLANT
TUBING UROLOGY SET (TUBING) ×2 IMPLANT

## 2020-06-08 NOTE — Discharge Instructions (Signed)
Indwelling Urinary Catheter Care, Adult °An indwelling urinary catheter is a thin tube that is put into your bladder. The tube helps to drain pee (urine) out of your body. The tube goes in through your urethra. Your urethra is where pee comes out of your body. Your pee will come out through the catheter, then it will go into a bag (drainage bag). °Take good care of your catheter so it will work well. °How to wear your catheter and bag °Supplies needed °· Sticky tape (adhesive tape) or a leg strap. °· Alcohol wipe or soap and water (if you use tape). °· A clean towel (if you use tape). °· Large overnight bag. °· Smaller bag (leg bag). °Wearing your catheter °Attach your catheter to your leg with tape or a leg strap. °· Make sure the catheter is not pulled tight. °· If a leg strap gets wet, take it off and put on a dry strap. °· If you use tape to hold the bag on your leg: °1. Use an alcohol wipe or soap and water to wash your skin where the tape made it sticky before. °2. Use a clean towel to pat-dry that skin. °3. Use new tape to make the bag stay on your leg. °Wearing your bags °You should have been given a large overnight bag. °· You may wear the overnight bag in the day or night. °· Always have the overnight bag lower than your bladder.  Do not let the bag touch the floor. °· Before you go to sleep, put a clean plastic bag in a wastebasket. Then hang the overnight bag inside the wastebasket. °You should also have a smaller leg bag that fits under your clothes. °· Always wear the leg bag below your knee. °· Do not wear your leg bag at night. °How to care for your skin and catheter °Supplies needed °· A clean washcloth. °· Water and mild soap. °· A clean towel. °Caring for your skin and catheter °· Clean the skin around your catheter every day: °1. Wash your hands with soap and water. °2. Wet a clean washcloth in warm water and mild soap. °3. Clean the skin around your urethra. °§ If you are male: °§ Gently  spread the folds of skin around your vagina (labia). °§ With the washcloth in your other hand, wipe the inner side of your labia on each side. Wipe from front to back. °§ If you are male: °§ Pull back any skin that covers the end of your penis (foreskin). °§ With the washcloth in your other hand, wipe your penis in small circles. Start wiping at the tip of your penis, then move away from the catheter. °§ Move the foreskin back in place, if needed. °4. With your free hand, hold the catheter close to where it goes into your body. °§ Keep holding the catheter during cleaning so it does not get pulled out. °5. With the washcloth in your other hand, clean the catheter. °§ Only wipe downward on the catheter. °§ Do not wipe upward toward your body. Doing this may push germs into your urethra and cause infection. °6. Use a clean towel to pat-dry the catheter and the skin around it. Make sure to wipe off all soap. °7. Wash your hands with soap and water. °· Shower every day. Do not take baths. °· Do not use cream, ointment, or lotion on the area where the catheter goes into your body, unless your doctor tells you to. °· Do not   use powders, sprays, or lotions on your genital area. °· Check your skin around the catheter every day for signs of infection. Check for: °? Redness, swelling, or pain. °? Fluid or blood. °? Warmth. °? Pus or a bad smell.  °  °  °How to empty the bag °Supplies needed °· Rubbing alcohol. °· Gauze pad or cotton ball. °· Tape or a leg strap. °Emptying the bag °Pour the pee out of your bag when it is ?-½ full, or at least 2-3 times a day. Do this for your overnight bag and your leg bag. °1. Wash your hands with soap and water. °2. Separate (detach) the bag from your leg. °3. Hold the bag over the toilet or a clean pail. Keep the bag lower than your hips and bladder. This is so the pee (urine) does not go back into the tube. °4. Open the pour spout. It is at the bottom of the bag. °5. Empty the pee into the  toilet or pail. Do not let the pour spout touch any surface. °6. Put rubbing alcohol on a gauze pad or cotton ball. °7. Use the gauze pad or cotton ball to clean the pour spout. °8. Close the pour spout. °9. Attach the bag to your leg with tape or a leg strap. °10. Wash your hands with soap and water. °Follow instructions for cleaning the drainage bag: °· From the product maker. °· As told by your doctor. °How to change the bag °Supplies needed °· Alcohol wipes. °· A clean bag. °· Tape or a leg strap. °Changing the bag °Replace your bag when it starts to leak, smell bad, or look dirty. °1. Wash your hands with soap and water. °2. Separate the dirty bag from your leg. °3. Pinch the catheter with your fingers so that pee does not spill out. °4. Separate the catheter tube from the bag tube where these tubes connect (at the connection valve). Do not let the tubes touch any surface. °5. Clean the end of the catheter tube with an alcohol wipe. Use a different alcohol wipe to clean the end of the bag tube. °6. Connect the catheter tube to the tube of the clean bag. °7. Attach the clean bag to your leg with tape or a leg strap. Do not make the bag tight on your leg. °8. Wash your hands with soap and water. °General rules °· Never pull on your catheter. Never try to take it out. Doing that can hurt you. °· Always wash your hands before and after you touch your catheter or bag. Use a mild, fragrance-free soap. If you do not have soap and water, use hand sanitizer. °· Always make sure there are no twists or bends (kinks) in the catheter tube. °· Always make sure there are no leaks in the catheter or bag. °· Drink enough fluid to keep your pee pale yellow. °· Do not take baths, swim, or use a hot tub. °· If you are male, wipe from front to back after you poop (have a bowel movement).   °Contact a doctor if: °· Your pee is cloudy. °· Your pee smells worse than usual. °· Your catheter gets clogged. °· Your catheter  leaks. °· Your bladder feels full. °Get help right away if: °· You have redness, swelling, or pain where the catheter goes into your body. °· You have fluid, blood, pus, or a bad smell coming from the area where the catheter goes into your body. °· Your skin feels   warm where the catheter goes into your body. °· You have a fever. °· You have pain in your: °? Belly (abdomen). °? Legs. °? Lower back. °? Bladder. °· You see blood in the catheter. °· Your pee is pink or red. °· You feel sick to your stomach (nauseous). °· You throw up (vomit). °· You have chills. °· Your pee is not draining into the bag. °· Your catheter gets pulled out. °Summary °· An indwelling urinary catheter is a thin tube that is placed into the bladder to help drain pee (urine) out of the body. °· The catheter is placed into the part of the body that drains pee from the bladder (urethra). °· Taking good care of your catheter will keep it working properly and help prevent problems. °· Always wash your hands before and after touching your catheter or bag. °· Never pull on your catheter or try to take it out. °This information is not intended to replace advice given to you by your health care provider. Make sure you discuss any questions you have with your health care provider. °Document Revised: 04/16/2018 Document Reviewed: 08/08/2016 °Elsevier Patient Education © 2021 [employer] ° °

## 2020-06-08 NOTE — Op Note (Signed)
Preop diagnosis: Left renal stones Postoperative diagnosis: Left renal stones  Procedure: Cystoscopy with left retrograde pyelogram, left ureteroscopy, laser lithotripsy, left ureteral stent placement, Foley catheter placement  Surgeon: Christian [name]  Anesthesia: General  Indication procedure: Christian [name] is a 75 year old male with a history of left flank pain.  CT scan revealed a large stone at the lower to mid pole infundibulum working its way toward the renal pelvis with a couple of smaller stones under it and 2 small stones in the upper pole.  He was brought today for ureteroscopy.  Findings: The glans meatus and penis all appeared normal.  On cystoscopy the urethra was quite tight.  The 23 French sheath dilated the urethra and I ended up switching to the 21 [place] sheath.  The bladder was unremarkable without lesion, stone or foreign body.  A left retrograde pyelogram-this outlined a single ureter single collecting system unit with a large filling defect in the mid to lower pole infundibulum consistent with the stone.  Maybe some mild dilation of the upper infundibulum and calyx.  On left ureteroscopy the stone was definitely working its way toward the UPJ and may have had a ball valving effect with some intermittent obstruction.  The stone had filled the lower infundibulum with smaller stones under it.  The small stones in the upper pole were located.  All stones were dusted and no clinically significant fragments were noted on fluoroscopy, visually or with basket sampling.  Description of procedure: After consent was obtained patient brought to the operating room.  After adequate anesthesia was placed lithotomy position and prepped and draped in the usual sterile fashion.  Timeout was performed to confirm the patient and procedure.  Cystoscope was passed per urethra and the urethra was quite tight.  Bladder inspection performed.  Left ureteral orifice was cannulated with a 6 [place] open-ended  catheter and retrograde injection of contrast was performed.  A sensor wire was then advanced and coiled in the upper pole calyx on the left.  I then used the access sheath was passed without difficulty to get 2 wires in place.  I then went adjacent to the zip wire leaving it as the safety wire.  A dual-lumen digital ureteroscope was then passed up into the kidney.  The 200 m laser fiber passed and the upper stones were dusted.  I then began to work on the lower pole stone.  It fragmented well and as it broke up it shifted around and although it was difficult to get to the lower pole I believe I was able to get all of the stone.  There were a lot of fragments in the lower pole and I believe these went over the smaller stones in the lower pole and everything ended up being dusted together.  I used an engage basket to pull some of the larger fragments out and placed those in the upper pole for ease of fragmentation.  This created some more room in the lower pole and everything appeared to be dusted.  I used an engage basket to take a sample of the lower pole and could not find any significant stone fragments.  I then completed dusting in the upper pole and took some of these fragments out with the engage basket and again no significant fragments remained.  Fluoroscopy checked again and no stones noted.  The large stone had been visible on the scout image.  I then backed the access sheath out on the ureteroscope and carefully again inspected the  collecting system, renal pelvis and ureter on the way out and again noted no other significant stone fragment or injury.  I tried to pass the 23 [place] scope to the bladder to irrigate the bladder prior to stent placement but the scope would not go now with the wire down the urethra is quite tight.  Switched over to the 21 [place] scope and there was some abrasion along the right urethra from scope passage which was oozing a little bit.  The bladder was drained and then the  scope backed out.  I then backloaded the wire on the cystoscope and a 6 x 26 cm stent advanced.  Wire was removed with a good coil seen in the upper pole calyx and a good coil in the bladder.  The scope was removed leaving the stent in place with a string.  Given the urethral oozing and some dilation I decided to place a 16 French Foley catheter.  Foley catheter was placed and seated at the bladder neck.  Stent looked good on fluoroscopy final check.  The string of the stent was then wrapped around the Foley and taped allowing the Foley and the stent to be removed next week.  He was then awakened taken to cover him in stable condition.  Complications: None  Blood loss: Minimal  Specimens: None  Drains: 6 x 26 cm left ureteral stent, Foley catheter  Disposition: Patient stable to PACU - I spoke to Christian [name] and went over the procedure, post-op care, follow-up.

## 2020-06-08 NOTE — H&P (Signed)
H&P  Chief Complaint: left renal stones   History of Present Illness:   Seen today for left URS/HLL/stent -   1) renal stone - pt underwent chest CT for emphysema/lung ca screening and a left renal stone was incompletely visualized. He underwent CT A/P 05/01/2020 with a 15x15x9 mm left mid-lower pole stone (HU1300, ssd 13 cm, visible) was noted along with 6 mm LLP stone and two 3 mm LUP stones.  He has had some left flank pain. No prior h/o stones. No hematuria.   He has a h/o CVA and on plavix. He has a loop recorder. He is on tamsulosin. He was discharged from cardiology and neurology.   He continues to have some left flank pain. No hematuria or dysuria.    Past Medical History:  Diagnosis Date  . Adenomatous colon polyp   . Allergy    seasonal  . Anxiety   . CKD (chronic kidney disease) stage 3, GFR 30-59 ml/min (HCC)   . Hyperlipidemia   . Hypertriglyceridemia   . PVD (peripheral vascular disease) (HCC)    decreased TBI bilaterally  . Smoker   . Stroke [facility])    Past Surgical History:  Procedure Laterality Date  . CHOLECYSTECTOMY  1987  . EYE SURGERY     lasik  . LOOP RECORDER INSERTION N/A 05/09/2019   Procedure: LOOP RECORDER INSERTION;  Surgeon: Evans Lance, MD;  Location: [place] [facility];  Service: Cardiovascular;  Laterality: N/A;    Home Medications:  Medications Prior to Admission  Medication Sig Dispense Refill Last Dose  . acetaminophen (TYLENOL) 325 MG tablet Take 2 tablets (650 mg total) by mouth every 4 (four) hours as needed for mild pain (or temp > 37.5 C (99.5 F)).   Past Week at Unknown time  . atorvastatin (LIPITOR) 40 MG tablet TAKE 1 TABLET BY MOUTH EVERY DAY (Patient taking differently: Take 40 mg by mouth daily.) 90 tablet 3 06/07/2020 at Unknown time  . cholecalciferol (VITAMIN D) 1000 UNITS tablet Take 1,000 Units by mouth daily.   Past Week at Unknown time  . clopidogrel (PLAVIX) 75 MG tablet Take 1 tablet (75 mg total) by mouth daily. 90  tablet 3 06/04/2020 at 0900  . diazepam (VALIUM) 5 MG tablet Take 1 tablet (5 mg total) by mouth every 12 (twelve) hours as needed for anxiety. 30 tablet 1 Past Month at Unknown time  . fluticasone (FLONASE) 50 MCG/ACT nasal spray PLACE 2 SPRAYS INTO EACH NOSTRIL DAILY (Patient taking differently: Place 2 sprays into both nostrils daily.) 48 mL 2 Past Week at Unknown time  . loratadine (CLARITIN) 10 MG tablet Take 10 mg by mouth daily.   06/08/2020 at Unknown time  . Multiple Vitamins-Minerals (MULTIVITAMIN WITH MINERALS) tablet Take 1 tablet by mouth daily.   Past Week at Unknown time  . tamsulosin (FLOMAX) 0.4 MG CAPS capsule TAKE 1 CAPSULE (0.4 MG TOTAL) BY MOUTH DAILY. TO HELP PASS KIDNEY STONE 30 capsule 0 06/08/2020 at Unknown time  . vitamin C (ASCORBIC ACID) 500 MG tablet Take 500 mg by mouth daily.   Past Week at Unknown time   Allergies: No Known Allergies  Family History  Problem Relation Age of Onset  . Hypertension Mother   . Stroke Mother   . Diabetes Mother        borderline  . Arthritis Father   . ALS Son   . Colon cancer Neg Hx   . Esophageal cancer Neg Hx   . Rectal  cancer Neg Hx   . Stomach cancer Neg Hx    Social History:  reports that he has been smoking cigarettes. He has a 25.00 pack-year smoking history. He has never used smokeless tobacco. He reports current alcohol use of about 14.0 standard drinks of alcohol per week. He reports that he does not use drugs.  ROS: A complete review of systems was performed.  All systems are negative except for pertinent findings as noted. Review of Systems  All other systems reviewed and are negative.    Physical Exam:  Vital signs in last 24 hours: Temp:  [98.4 F (36.9 C)] 98.4 F (36.9 C) (06/03 0715) Pulse Rate:  [71] 71 (06/03 0715) BP: (133)/(76) 133/76 (06/03 0715) SpO2:  [100 %] 100 % (06/03 0715) Weight:  [78.5 kg] 78.5 kg (06/03 0715) General:  Alert and oriented, No acute distress HEENT: Normocephalic,  atraumatic Cardiovascular: Regular rate and rhythm Lungs: Regular rate and effort Abdomen: Soft, nontender, nondistended, no abdominal masses Back: No CVA tenderness Extremities: No edema Neurologic: Grossly intact  Laboratory Data:  No results found for this or any previous visit (from the past 24 hour(s)). No results found for this or any previous visit (from the past 240 hour(s)). Creatinine: No results for input(s): CREATININE in the last 168 hours.  Impression/Assessment/plan:  I discussed with the patient the nature, potential benefits, risks and alternatives to cystoscopy, left retrograde pyelogram, left URS/HLL/stent, including side effects of the proposed treatment, the likelihood of the patient achieving the goals of the procedure, and any potential problems that might occur during the procedure or recuperation. We discussed he may need a staged procedure given stone burden and location. All questions answered. Patient elects to proceed.       Festus Aloe 06/08/2020, 9:01 AM

## 2020-06-08 NOTE — Anesthesia Postprocedure Evaluation (Signed)
Anesthesia Post Note  Patient: Christian [name]  Procedure(s) Performed: CYSTOSCOPY/RETROGRADE/URETEROSCOPY/HOLMIUM LASER/STENT PLACEMENT (Left )     Patient location during evaluation: PACU Anesthesia Type: General Level of consciousness: awake and alert, oriented and patient cooperative Pain management: pain level controlled Vital Signs Assessment: post-procedure vital signs reviewed and stable Respiratory status: spontaneous breathing, nonlabored ventilation and respiratory function stable Cardiovascular status: blood pressure returned to baseline and stable Postop Assessment: no apparent nausea or vomiting Anesthetic complications: no   No complications documented.  Last Vitals:  Vitals:   06/08/20 0715  BP: 133/76  Pulse: 71  Temp: 36.9 C  SpO2: 100%    Last Pain:  Vitals:   06/08/20 0739  TempSrc:   PainSc: 0-No pain                 Pervis Hocking

## 2020-06-08 NOTE — Anesthesia Procedure Notes (Signed)
Procedure Name: LMA Insertion Date/Time: 06/08/2020 9:07 AM Performed by: Justice Rocher, CRNA Pre-anesthesia Checklist: Patient identified, Emergency Drugs available, Suction available, Patient being monitored and Timeout performed Patient Re-evaluated:Patient Re-evaluated prior to induction Oxygen Delivery Method: Circle system utilized Preoxygenation: Pre-oxygenation with 100% oxygen Induction Type: IV induction Ventilation: Mask ventilation without difficulty LMA: LMA inserted LMA Size: 5.0 Number of attempts: 1 Airway Equipment and Method: Bite block Placement Confirmation: positive ETCO2,  breath sounds checked- equal and bilateral and CO2 detector Tube secured with: Tape Dental Injury: Teeth and Oropharynx as per pre-operative assessment

## 2020-06-08 NOTE — Transfer of Care (Signed)
Immediate Anesthesia Transfer of Care Note  Patient: Christian [name]  Procedure(s) Performed: Procedure(s) (LRB): CYSTOSCOPY/RETROGRADE/URETEROSCOPY/HOLMIUM LASER/STENT PLACEMENT (Left)  Patient Location: PACU  Anesthesia Type: General  Level of Consciousness: awake, sedated, patient cooperative and responds to stimulation  Airway & Oxygen Therapy: Patient Spontanous Breathing and Patient connected to face mask oxygen and soft FM  Post-op Assessment: Report given to PACU RN, Post -op Vital signs reviewed and stable and Patient moving all extremities  Post vital signs: Reviewed and stable  Complications: No apparent anesthesia complications

## 2020-06-09 ENCOUNTER — Encounter (HOSPITAL_COMMUNITY): Payer: Self-pay | Admitting: Urology

## 2020-06-11 ENCOUNTER — Other Ambulatory Visit: Payer: Self-pay | Admitting: Urology

## 2020-06-11 DIAGNOSIS — N200 Calculus of kidney: Secondary | ICD-10-CM

## 2020-06-13 LAB — CUP PACEART REMOTE DEVICE CHECK
Date Time Interrogation Session: 20220608000854
Implantable Pulse Generator Implant Date: 20210503

## 2020-06-14 ENCOUNTER — Ambulatory Visit (INDEPENDENT_AMBULATORY_CARE_PROVIDER_SITE_OTHER): Payer: Medicare Other

## 2020-06-14 ENCOUNTER — Other Ambulatory Visit: Payer: Self-pay | Admitting: *Deleted

## 2020-06-14 ENCOUNTER — Other Ambulatory Visit: Payer: Self-pay

## 2020-06-14 ENCOUNTER — Telehealth: Payer: Self-pay

## 2020-06-14 DIAGNOSIS — N200 Calculus of kidney: Secondary | ICD-10-CM | POA: Diagnosis not present

## 2020-06-14 MED ORDER — CLOPIDOGREL BISULFATE 75 MG PO TABS: 75.0000 mg | ORAL_TABLET | Freq: Every day | ORAL | 3 refills | Status: DC

## 2020-06-14 NOTE — Telephone Encounter (Signed)
-----   Message from Festus Aloe, MD sent at 06/14/2020  1:30 PM EDT ----- He can stop tamsulosin/Flomax. Thanks!   ----- Message ----- From: Iris Pert, LPN Sent: 04/13/5925  63:94 AM EDT To: Festus Aloe, MD  Does patient need to continue on his Flomax?

## 2020-06-14 NOTE — Telephone Encounter (Signed)
Patient called and made aware.

## 2020-06-14 NOTE — Progress Notes (Signed)
Catheter Removal  Patient is present today for a catheter removal.  78ml of water was drained from the balloon. A 16FR foley cath with tethered stent was removed from the bladder no complications were noted . Patient tolerated well.  Performed by: Rozina Pointer, LPN  Follow up/ Additional notes: Keep next scheduled OV

## 2020-06-18 ENCOUNTER — Ambulatory Visit (INDEPENDENT_AMBULATORY_CARE_PROVIDER_SITE_OTHER): Payer: Medicare Other

## 2020-06-18 DIAGNOSIS — I639 Cerebral infarction, unspecified: Secondary | ICD-10-CM | POA: Diagnosis not present

## 2020-06-20 ENCOUNTER — Other Ambulatory Visit: Payer: Self-pay | Admitting: Family Medicine

## 2020-06-27 ENCOUNTER — Telehealth: Payer: Self-pay | Admitting: *Deleted

## 2020-06-27 NOTE — Telephone Encounter (Signed)
Received call from patient.   States that he is scheduling tooth extraction and wanted recommendations for Plavix.   Advised that typical recommendations are to hold Plavix 5 days prior to extraction and to resume medication the day after extraction. Advised that dentist will fax request once extraction is scheduled.   MD to review for more detailed instructions.

## 2020-06-28 NOTE — Telephone Encounter (Signed)
Call placed to patient and patient made aware.   Advised to have dentist fax form once appointment has been scheduled.

## 2020-07-04 ENCOUNTER — Other Ambulatory Visit: Payer: Self-pay | Admitting: Family Medicine

## 2020-07-06 NOTE — Progress Notes (Signed)
Carelink Summary Report / Loop Recorder 

## 2020-07-13 ENCOUNTER — Ambulatory Visit (HOSPITAL_COMMUNITY)
Admission: RE | Admit: 2020-07-13 | Discharge: 2020-07-13 | Disposition: A | Discharge status: Home / Self Care | Payer: Medicare Other | Source: Ambulatory Visit | Attending: Urology | Admitting: Urology

## 2020-07-13 ENCOUNTER — Other Ambulatory Visit: Payer: Self-pay

## 2020-07-13 DIAGNOSIS — N200 Calculus of kidney: Secondary | ICD-10-CM

## 2020-07-18 ENCOUNTER — Ambulatory Visit (HOSPITAL_COMMUNITY): Payer: Medicare Other

## 2020-07-19 LAB — CUP PACEART REMOTE DEVICE CHECK
Date Time Interrogation Session: 20220711001314
Implantable Pulse Generator Implant Date: 20210503

## 2020-07-23 ENCOUNTER — Ambulatory Visit (INDEPENDENT_AMBULATORY_CARE_PROVIDER_SITE_OTHER): Payer: Medicare Other

## 2020-07-23 ENCOUNTER — Other Ambulatory Visit: Payer: Self-pay

## 2020-07-23 ENCOUNTER — Ambulatory Visit (INDEPENDENT_AMBULATORY_CARE_PROVIDER_SITE_OTHER): Payer: Medicare Other | Admitting: Urology

## 2020-07-23 VITALS — BP 126/85 | HR 63 | Wt 170.0 lb

## 2020-07-23 DIAGNOSIS — I639 Cerebral infarction, unspecified: Secondary | ICD-10-CM

## 2020-07-23 DIAGNOSIS — N200 Calculus of kidney: Secondary | ICD-10-CM | POA: Diagnosis not present

## 2020-07-23 NOTE — Progress Notes (Signed)

## 2020-07-23 NOTE — Progress Notes (Signed)
07/23/2020 9:29 AM   Christian [name] 09-23-1945 [id number]  Referring provider: Susy Frizzle, MD [address] [place],  [place] [address]  CC: kidney stones  HPI:  1) renal stone - pt underwent chest CT for emphysema/lung ca screening and a left renal stone was incompletely visualized. He underwent CT A/P 05/01/2020 with a 15x15x9 mm left mid-lower pole stone ([id number], ssd 13 cm, visible) was noted along with 6 mm LLP stone and two 3 mm LUP stones. Due to intermittent left flank pain he was taken for left URS/HLL/stent 06/22. He pulled the stent. Renal US done 07/22 with no hydro noted. Possibly some stone fragment remain on the LLP. He's not passed any notable fragments (stones were dusted) and no dysuria or gross hematuria.    He has a h/o CVA and on plavix. He has a loop recorder. He is on tamsulosin. He was discharged from cardiology and neurology.   PMH: Past Medical History:  Diagnosis Date   Adenomatous colon polyp    Allergy    seasonal   Anxiety    CKD (chronic kidney disease) stage 3, GFR 30-59 ml/min (HCC)    Hyperlipidemia    Hypertriglyceridemia    PVD (peripheral vascular disease) (HCC)    decreased TBI bilaterally   Smoker    Stroke [facility])     Surgical History: Past Surgical History:  Procedure Laterality Date   CHOLECYSTECTOMY  1987   CYSTOSCOPY/URETEROSCOPY/HOLMIUM LASER/STENT PLACEMENT Left 06/08/2020   Procedure: CYSTOSCOPY/RETROGRADE/URETEROSCOPY/HOLMIUM LASER/STENT PLACEMENT;  Surgeon: Festus Aloe, MD;  Location: [facility];  Service: Urology;  Laterality: Left;   EYE SURGERY     lasik   LOOP RECORDER INSERTION N/A 05/09/2019   Procedure: LOOP RECORDER INSERTION;  Surgeon: Evans Lance, MD;  Location: [place] [facility];  Service: Cardiovascular;  Laterality: N/A;    Home Medications:  Allergies as of 07/23/2020   No Known Allergies      Medication List        Accurate as of July 23, 2020  9:29 AM. If you have any questions, ask  your nurse or doctor.          acetaminophen 325 MG tablet Commonly known as: TYLENOL Take 2 tablets (650 mg total) by mouth every 4 (four) hours as needed for mild pain (or temp > 37.5 C (99.5 F)).   atorvastatin 40 MG tablet Commonly known as: LIPITOR TAKE 1 TABLET BY MOUTH EVERY DAY   cholecalciferol 1000 units tablet Commonly known as: VITAMIN D Take 1,000 Units by mouth daily.   clopidogrel 75 MG tablet Commonly known as: PLAVIX Take 1 tablet (75 mg total) by mouth daily.   diazepam 5 MG tablet Commonly known as: VALIUM Take 1 tablet (5 mg total) by mouth every 12 (twelve) hours as needed for anxiety.   fluticasone 50 MCG/ACT nasal spray Commonly known as: FLONASE PLACE 2 SPRAYS INTO EACH NOSTRIL DAILY What changed: See the new instructions.   loratadine 10 MG tablet Commonly known as: CLARITIN Take 10 mg by mouth daily.   multivitamin with minerals tablet Take 1 tablet by mouth daily.   vitamin C 500 MG tablet Commonly known as: ASCORBIC ACID Take 500 mg by mouth daily.        Allergies: No Known Allergies  Family History: Family History  Problem Relation Age of Onset   Hypertension Mother    Stroke Mother    Diabetes Mother        borderline  Arthritis Father    ALS Son    Colon cancer Neg Hx    Esophageal cancer Neg Hx    Rectal cancer Neg Hx    Stomach cancer Neg Hx     Social History:  reports that he has been smoking cigarettes. He has a 25.00 pack-year smoking history. He has never used smokeless tobacco. He reports current alcohol use of about 14.0 standard drinks of alcohol per week. He reports that he does not use drugs.   Physical Exam: There were no vitals taken for this visit.  Constitutional:  Alert and oriented, No acute distress. HEENT: [place] AT, moist mucus membranes.  Trachea midline, no masses. Cardiovascular: No clubbing, cyanosis, or edema. Respiratory: Normal respiratory effort, no increased work of breathing. GI:  Abdomen is soft, nontender, nondistended, no abdominal masses GU: No CVA tenderness Lymph: No cervical or inguinal lymphadenopathy. Skin: No rashes, bruises or suspicious lesions. Neurologic: Grossly intact, no focal deficits, moving all 4 extremities. Psychiatric: Normal mood and affect.  Laboratory Data: Lab Results  Component Value Date   WBC 7.4 05/30/2020   HGB 15.1 05/30/2020   HCT 45.4 05/30/2020   MCV 90.8 05/30/2020   PLT 249 05/30/2020    Lab Results  Component Value Date   CREATININE 1.30 (H) 05/30/2020    Lab Results  Component Value Date   PSA 0.3 08/09/2019   PSA 0.3 08/06/2018   PSA 0.4 07/30/2017    No results found for: TESTOSTERONE  Lab Results  Component Value Date   HGBA1C 5.6 02/13/2020    Urinalysis    Component Value Date/Time   COLORURINE STRAW (A) 05/08/2019 2151   APPEARANCEUR Clear 05/07/2020 1421   LABSPEC 1.009 05/08/2019 2151   PHURINE 7.0 05/08/2019 2151   GLUCOSEU Negative 05/07/2020 1421   HGBUR NEGATIVE 05/08/2019 2151   BILIRUBINUR Negative 05/07/2020 1421   KETONESUR NEGATIVE 05/08/2019 2151   PROTEINUR Negative 05/07/2020 1421   PROTEINUR NEGATIVE 05/08/2019 2151   NITRITE Negative 05/07/2020 1421   NITRITE NEGATIVE 05/08/2019 2151   LEUKOCYTESUR 1+ (A) 05/07/2020 1421   LEUKOCYTESUR TRACE (A) 05/08/2019 2151    Lab Results  Component Value Date   LABMICR See below: 05/07/2020   WBCUA 0-5 05/07/2020   LABEPIT None seen 05/07/2020   MUCUS Present 05/07/2020   BACTERIA None seen 05/07/2020    Pertinent Imaging: CT scan and [place] images - reviewed  No results found for this or any previous visit.  No results found for this or any previous visit.  No results found for this or any previous visit.  No results found for this or any previous visit.  Results for orders placed during the hospital encounter of 07/13/20  US RENAL  Narrative CLINICAL DATA:  75 year old male with follow-up kidney stone.  EXAM: RENAL  / URINARY TRACT ULTRASOUND COMPLETE  COMPARISON:  CT abdomen pelvis dated 05/01/2020.  FINDINGS: Right Kidney:  Renal measurements: 10.3 x 5.2 x 5.7 cm = volume: 157 mL. Normal echogenicity. There is a 1 cm nonobstructing inferior pole calculus. No hydronephrosis.  Left Kidney:  Renal measurements: 10.1 x 5.9 x 5.2 cm = volume: 160 mL. Normal echogenicity. There is an 8 mm inferior pole calculus. No hydronephrosis.  Bladder:  Appears normal for degree of bladder distention. The prevoid bladder volume is 118 cc and postvoid volume of 9 cc.  Other:  None.  IMPRESSION: Nonobstructing bilateral renal calculi.  No hydronephrosis.   Electronically Signed By: Anner Crete M.D. On: 07/14/2020 00:03  No results found for this or any previous visit.  No results found for this or any previous visit.  Results for orders placed during the hospital encounter of 05/01/20  CT RENAL STONE STUDY  Narrative CLINICAL DATA:  Follow-up kidney stones  EXAM: CT ABDOMEN AND PELVIS WITHOUT CONTRAST  TECHNIQUE: Multidetector CT imaging of the abdomen and pelvis was performed following the standard protocol without IV contrast.  COMPARISON:  Partial comparison to CT chest dated 02/28/2020. Renal ultrasound dated 08/16/2018.  FINDINGS: Lower chest: Lung bases are clear.  Hepatobiliary: Unenhanced liver is unremarkable.  Status post cholecystectomy. No intrahepatic or extrahepatic ductal dilatation.  Pancreas: Within normal limits.  Spleen: Calcified splenic granulomata.  Adrenals/Urinary Tract: Adrenal glands are within normal limits.  Right kidney is within normal limits.  Two nonobstructing left upper pole renal calculi measuring 3 mm (series 2/image 27). Three nonobstructing left lower pole renal calculi measuring up to 5 mm (coronal image 65).  Additional 15 mm calculus in the left lower pole renal collecting system (series 2/image 34) with mild fullness of the  collecting system and surrounding inflammatory changes.  No ureteral or bladder calculi.  Bladder is within normal limits.  Stomach/Bowel: Stomach is within normal limits.  No evidence of bowel obstruction.  Normal appendix (series 2/image 32).  Left colonic diverticulosis, without evidence of diverticulitis.  Vascular/Lymphatic: No evidence of abdominal aortic aneurysm.  Atherosclerotic calcifications of the abdominal aorta and branch vessels.  No suspicious abdominopelvic lymphadenopathy.  Reproductive: Prostate is unremarkable.  Other: No abdominopelvic ascites.  Tiny fat containing left inguinal hernia (series 2/image 74). Tiny fat containing periumbilical hernia (series 2/image 49).  Musculoskeletal: No focal osseous lesions.  IMPRESSION: 15 mm calculus in the left lower pole collecting system with mild fullness and surrounding inflammatory changes.  Additional nonobstructing left renal calculi measuring up to 5 mm.  Additional ancillary findings as above.   Electronically Signed By: Julian Hy M.D. On: 05/01/2020 15:31   Assessment & Plan:    1. Nephrolithiasis - resolved. He is moving to [facility]. We discussed diet changes to prevent stones.    No follow-ups on file.  Festus Aloe, MD  [facility]  [address] [place], [place] [address] [phone]

## 2020-07-25 ENCOUNTER — Other Ambulatory Visit: Payer: Self-pay | Admitting: Family Medicine

## 2020-07-31 ENCOUNTER — Telehealth: Payer: Self-pay | Admitting: *Deleted

## 2020-07-31 NOTE — Chronic Care Management (AMB) (Signed)
  Chronic Care Management   Outreach Note  07/31/2020 Name: RURY NISBETT MRN: [id number] DOB: 02/19/1945  Cristino Martes is a 75 y.o. year old male who is a primary care patient of Pickard, Cammie Mcgee, MD. I reached out to Cristino Martes by phone today in response to a referral sent by Mr. Javoris Steenhoek [facility] PCP, Dr. Dennard Schaumann.      An unsuccessful telephone outreach was attempted today. The patient was referred to the case management team for assistance with care management and care coordination.   Follow Up Plan: A HIPAA compliant phone message was left for the patient providing contact information and requesting a return call. The care management team will reach out to the patient again over the next 7 days.  If patient returns call to provider office, please advise to call [place] at [phone].  [place] Management  Direct Dial: [phone]

## 2020-08-07 NOTE — Chronic Care Management (AMB) (Signed)
  Chronic Care Management   Outreach Note  08/07/2020 Name: BURCH BREITKREUTZ MRN: [id number] DOB: [birth year]  Cristino Martes is a 75 y.o. year old male who is a primary care patient of Pickard, Cammie Mcgee, MD. I reached out to Cristino Martes by phone today in response to a referral sent by Mr. Sahmir Bassette [facility] PCP, Dr. Dennard Schaumann.      A second unsuccessful telephone outreach was attempted today. The patient was referred to the case management team for assistance with care management and care coordination.   Follow Up Plan: A HIPAA compliant phone message was left for the patient providing contact information and requesting a return call. The care management team will reach out to the patient again over the next 7 days.  If patient returns call to provider office, please advise to call [place] at [phone].  [place] Management  Direct Dial: [phone]

## 2020-08-10 ENCOUNTER — Other Ambulatory Visit: Payer: Self-pay

## 2020-08-10 ENCOUNTER — Other Ambulatory Visit: Payer: Medicare Other

## 2020-08-10 DIAGNOSIS — I639 Cerebral infarction, unspecified: Secondary | ICD-10-CM | POA: Diagnosis not present

## 2020-08-10 DIAGNOSIS — Z13220 Encounter for screening for lipoid disorders: Secondary | ICD-10-CM

## 2020-08-10 DIAGNOSIS — E7800 Pure hypercholesterolemia, unspecified: Secondary | ICD-10-CM | POA: Diagnosis not present

## 2020-08-10 DIAGNOSIS — Z1159 Encounter for screening for other viral diseases: Secondary | ICD-10-CM | POA: Diagnosis not present

## 2020-08-10 DIAGNOSIS — N183 Chronic kidney disease, stage 3 unspecified: Secondary | ICD-10-CM

## 2020-08-10 DIAGNOSIS — Z125 Encounter for screening for malignant neoplasm of prostate: Secondary | ICD-10-CM

## 2020-08-13 ENCOUNTER — Ambulatory Visit (INDEPENDENT_AMBULATORY_CARE_PROVIDER_SITE_OTHER): Payer: Medicare Other | Admitting: Family Medicine

## 2020-08-13 ENCOUNTER — Other Ambulatory Visit: Payer: Self-pay

## 2020-08-13 ENCOUNTER — Encounter: Payer: Self-pay | Admitting: Family Medicine

## 2020-08-13 VITALS — BP 118/66 | HR 76 | Temp 97.6°F | Resp 16 | Ht 69.0 in | Wt 169.0 lb

## 2020-08-13 DIAGNOSIS — Z720 Tobacco use: Secondary | ICD-10-CM

## 2020-08-13 DIAGNOSIS — F17200 Nicotine dependence, unspecified, uncomplicated: Secondary | ICD-10-CM | POA: Diagnosis not present

## 2020-08-13 DIAGNOSIS — Z0001 Encounter for general adult medical examination with abnormal findings: Secondary | ICD-10-CM | POA: Diagnosis not present

## 2020-08-13 DIAGNOSIS — Z122 Encounter for screening for malignant neoplasm of respiratory organs: Secondary | ICD-10-CM

## 2020-08-13 DIAGNOSIS — I6930 Unspecified sequelae of cerebral infarction: Secondary | ICD-10-CM

## 2020-08-13 DIAGNOSIS — Z0000 Encounter for general adult medical examination without abnormal findings: Secondary | ICD-10-CM

## 2020-08-13 DIAGNOSIS — I639 Cerebral infarction, unspecified: Secondary | ICD-10-CM

## 2020-08-13 MED ORDER — DIAZEPAM 5 MG PO TABS: 5.0000 mg | ORAL_TABLET | Freq: Two times a day (BID) | ORAL | 1 refills | Status: AC | PRN

## 2020-08-13 NOTE — Progress Notes (Signed)
Subjective:     Patient ID: Christian [name], male   DOB: 04/02/1945, 75 y.o.   MRN: [id number]  HPI  Patient is a very pleasant 75 year old Caucasian male here today for complete physical exam.  Patient has a PMH of a cryptogenic stroke.  He originally completed 3 months of dual antiplatelet therapy with aspirin and Plavix.  Neurology recommended switching to Plavix alone after completing 3 months.  Last colonoscopy was 10/2016.  Polyp was removed and found to be tubular adenoma.  Recommended repeat colonoscopy in 2023.  Denies falls, depression, or memory loss. Due for Shingrix. Negative AAA screen in 2016.  Patient continues to smoke.  He has no desire to quit at the present time.  He is using Valium sparingly for anxiety and sleep.  30 tablets have lasted him more than 3 months.  He is requesting a refill on this.  He does have a red scaly patch on his left cheek that is approximately 1.2 cm in diameter.  This appears to be an actinic keratoses.  However he is planning to leave to moved to [place] permanently in 2 days.  Therefore I recommended that he establish with a doctor in [place] to monitor this area or treated with liquid nitrogen cryotherapy.  Immunization History  Administered Date(s) Administered   Influenza, High Dose Seasonal PF 10/20/2013, 11/01/2015, 09/29/2016, 10/15/2017   Influenza-Unspecified 10/06/2012   PFIZER(Purple Top)SARS-COV-2 Vaccination 01/23/2019, 02/13/2019   Pneumococcal Conjugate-13 07/01/2013   Pneumococcal Polysaccharide-23 05/18/2009, 07/30/2015   Tdap 01/07/2011   Zoster, Live 06/26/2011   Lab on 08/10/2020  Component Date Value Ref Range Status   WBC 08/10/2020 7.7  3.8 - 10.8 Thousand/uL Final   RBC 08/10/2020 5.01  4.20 - 5.80 Million/uL Final   Hemoglobin 08/10/2020 15.3  13.2 - 17.1 g/dL Final   HCT 08/10/2020 45.2  38.5 - 50.0 % Final   MCV 08/10/2020 90.2  80.0 - 100.0 fL Final   MCH 08/10/2020 30.5  27.0 - 33.0 pg Final   MCHC 08/10/2020 33.8  32.0 -  36.0 g/dL Final   RDW 08/10/2020 13.7  11.0 - 15.0 % Final   Platelets 08/10/2020 228  140 - 400 Thousand/uL Final   MPV 08/10/2020 9.7  7.5 - 12.5 fL Final   Neutro Abs 08/10/2020 4,643  1,500 - 7,800 cells/uL Final   Lymphs Abs 08/10/2020 2,087  850 - 3,900 cells/uL Final   Absolute Monocytes 08/10/2020 685  200 - 950 cells/uL Final   Eosinophils Absolute 08/10/2020 208  15 - 500 cells/uL Final   Basophils Absolute 08/10/2020 77  0 - 200 cells/uL Final   Neutrophils Relative % 08/10/2020 60.3  % Final   Total Lymphocyte 08/10/2020 27.1  % Final   Monocytes Relative 08/10/2020 8.9  % Final   Eosinophils Relative 08/10/2020 2.7  % Final   Basophils Relative 08/10/2020 1.0  % Final   Glucose, Bld 08/10/2020 104 (A) 65 - 99 mg/dL Final   Comment: .            Fasting reference interval . For someone without known diabetes, a glucose value between 100 and 125 mg/dL is consistent with prediabetes and should be confirmed with a follow-up test. .    BUN 08/10/2020 14  7 - 25 mg/dL Final   Creat 08/10/2020 1.24  0.70 - 1.28 mg/dL Final   eGFR 08/10/2020 60  > OR = 60 mL/min/1.76m Final   Comment: The eGFR is based on the CKD-EPI 2021 equation. To calculate  the new eGFR from a previous Creatinine or Cystatin C result, go to https://www.kidney.org/professionals/ kdoqi/gfr%5Fcalculator    BUN/Creatinine Ratio 37/85/8850 NOT APPLICABLE  6 - 22 (calc) Final   Sodium 08/10/2020 141  135 - 146 mmol/L Final   Potassium 08/10/2020 4.8  3.5 - 5.3 mmol/L Final   Chloride 08/10/2020 107  98 - 110 mmol/L Final   CO2 08/10/2020 24  20 - 32 mmol/L Final   Calcium 08/10/2020 8.9  8.6 - 10.3 mg/dL Final   Total Protein 08/10/2020 6.2  6.1 - 8.1 g/dL Final   Albumin 08/10/2020 4.1  3.6 - 5.1 g/dL Final   Globulin 08/10/2020 2.1  1.9 - 3.7 g/dL (calc) Final   AG Ratio 08/10/2020 2.0  1.0 - 2.5 (calc) Final   Total Bilirubin 08/10/2020 0.7  0.2 - 1.2 mg/dL Final   Alkaline phosphatase (APISO)  08/10/2020 129  35 - 144 U/L Final   AST 08/10/2020 15  10 - 35 U/L Final   ALT 08/10/2020 19  9 - 46 U/L Final   Cholesterol 08/10/2020 151  <200 mg/dL Final   HDL 08/10/2020 63  > OR = 40 mg/dL Final   Triglycerides 08/10/2020 139  <150 mg/dL Final   LDL Cholesterol (Calc) 08/10/2020 66  mg/dL (calc) Final   Comment: Reference range: <100 . Desirable range <100 mg/dL for primary prevention;   <70 mg/dL for patients with CHD or diabetic patients  with > or = 2 CHD risk factors. Marland Kitchen LDL-C is now calculated using the Martin-Hopkins  calculation, which is a validated novel method providing  better accuracy than the Friedewald equation in the  estimation of LDL-C.  Cresenciano Genre et al. Annamaria Helling. 2774;128(78): 2061-2068  (http://education.[employer].com/faq/FAQ164)    Total CHOL/HDL Ratio 08/10/2020 2.4  <5.0 (calc) Final   Non-HDL Cholesterol (Calc) 08/10/2020 88  <130 mg/dL (calc) Final   Comment: For patients with diabetes plus 1 major ASCVD risk  factor, treating to a non-HDL-C goal of <100 mg/dL  (LDL-C of <70 mg/dL) is considered a therapeutic  option.    PSA 08/10/2020 0.42  < OR = 4.00 ng/mL Final   Comment: The total PSA value from this assay system is  standardized against the WHO standard. The test  result will be approximately 20% lower when compared  to the equimolar-standardized total PSA (Beckman  Coulter). Comparison of serial PSA results should be  interpreted with this fact in mind. . This test was performed using the Siemens  chemiluminescent method. Values obtained from  different assay methods cannot be used interchangeably. PSA levels, regardless of value, should not be interpreted as absolute evidence of the presence or absence of disease.      Past Medical History:  Diagnosis Date   Adenomatous colon polyp    Allergy    seasonal   Anxiety    CKD (chronic kidney disease) stage 3, GFR 30-59 ml/min (HCC)    Hyperlipidemia    Hypertriglyceridemia     PVD (peripheral vascular disease) ([place])    decreased TBI bilaterally   Smoker    Stroke [facility])    Past Surgical History:  Procedure Laterality Date   CHOLECYSTECTOMY  1987   CYSTOSCOPY/URETEROSCOPY/HOLMIUM LASER/STENT PLACEMENT Left 06/08/2020   Procedure: CYSTOSCOPY/RETROGRADE/URETEROSCOPY/HOLMIUM LASER/STENT PLACEMENT;  Surgeon: Festus Aloe, MD;  Location[facility];  Service: Urology;  Laterality: Left;   EYE SURGERY     lasik   LOOP RECORDER INSERTION N/A 05/09/2019   Procedure: LOOP RECORDER INSERTION;  Surgeon: Evans Lance, MD;  Location: [place];  Service: Cardiovascular;  Laterality: N/A;   Current Outpatient Medications on File Prior to Visit  Medication Sig Dispense Refill   acetaminophen (TYLENOL) 325 MG tablet Take 2 tablets (650 mg total) by mouth every 4 (four) hours as needed for mild pain (or temp > 37.5 C (99.5 F)).     atorvastatin (LIPITOR) 40 MG tablet TAKE 1 TABLET BY MOUTH EVERY DAY 90 tablet 3   cholecalciferol (VITAMIN D) 1000 UNITS tablet Take 1,000 Units by mouth daily.     clopidogrel (PLAVIX) 75 MG tablet Take 1 tablet (75 mg total) by mouth daily. 90 tablet 3   diazepam (VALIUM) 5 MG tablet Take 1 tablet (5 mg total) by mouth every 12 (twelve) hours as needed for anxiety. 30 tablet 1   fluticasone (FLONASE) 50 MCG/ACT nasal spray PLACE 2 SPRAYS INTO EACH NOSTRIL DAILY (Patient taking differently: Place 2 sprays into both nostrils daily.) 48 mL 2   loratadine (CLARITIN) 10 MG tablet Take 10 mg by mouth daily.     Multiple Vitamins-Minerals (MULTIVITAMIN WITH MINERALS) tablet Take 1 tablet by mouth daily.     vitamin C (ASCORBIC ACID) 500 MG tablet Take 500 mg by mouth daily.     No current facility-administered medications on file prior to visit.   No Known Allergies Social History   Socioeconomic History   Marital status: Married    Spouse name: Not on file   Number of children: Not on file   Years of education: Not on file   Highest  education level: Not on file  Occupational History   Not on file  Tobacco Use   Smoking status: Every Day    Packs/day: 0.50    Years: 50.00    Pack years: 25.00    Types: Cigarettes   Smokeless tobacco: Never  Vaping Use   Vaping Use: Never used  Substance and Sexual Activity   Alcohol use: Yes    Alcohol/week: 14.0 standard drinks    Types: 7 Shots of liquor, 7 Standard drinks or equivalent per week    Comment: 1 drink per day per pt   Drug use: No   Sexual activity: Not on file  Other Topics Concern   Not on file  Social History Narrative   Not on file   Social Determinants of Health   Financial Resource Strain: Not on file  Food Insecurity: Not on file  Transportation Needs: Not on file  Physical Activity: Not on file  Stress: Not on file  Social Connections: Not on file  Intimate Partner Violence: Not on file   Family History  Problem Relation Age of Onset   Hypertension Mother    Stroke Mother    Diabetes Mother        borderline   Arthritis Father    ALS Son    Colon cancer Neg Hx    Esophageal cancer Neg Hx    Rectal cancer Neg Hx    Stomach cancer Neg Hx      Review of Systems     Objective:   Physical Exam Vitals reviewed.  Constitutional:      General: He is not in acute distress.    Appearance: Normal appearance. He is normal weight. He is not ill-appearing, toxic-appearing or diaphoretic.  HENT:     Head: Normocephalic and atraumatic.      Right Ear: Tympanic membrane and ear canal normal. There is no impacted cerumen.     Left Ear:  Tympanic membrane and ear canal normal. There is no impacted cerumen.     Nose: Nose normal. No congestion or rhinorrhea.     Mouth/Throat:     Mouth: Mucous membranes are moist.     Pharynx: Oropharynx is clear. No oropharyngeal exudate.  Eyes:     General: No scleral icterus.    Extraocular Movements: Extraocular movements intact.     Conjunctiva/sclera: Conjunctivae normal.     Pupils: Pupils are  equal, round, and reactive to light.  Neck:     Vascular: No carotid bruit.  Cardiovascular:     Rate and Rhythm: Normal rate and regular rhythm.     Pulses: Normal pulses.     Heart sounds: Normal heart sounds. No murmur heard.   No friction rub. No gallop.  Pulmonary:     Effort: Pulmonary effort is normal. No respiratory distress.     Breath sounds: Normal breath sounds. No wheezing, rhonchi or rales.  Abdominal:     General: Abdomen is flat. Bowel sounds are normal. There is no distension.     Palpations: Abdomen is soft. There is no mass.     Tenderness: There is no abdominal tenderness. There is no guarding or rebound.     Hernia: No hernia is present.  Musculoskeletal:        General: Normal range of motion.     Cervical back: Normal range of motion and neck supple.     Right lower leg: No edema.     Left lower leg: No edema.  Lymphadenopathy:     Cervical: No cervical adenopathy.  Skin:    General: Skin is warm.     Coloration: Skin is not jaundiced.     Findings: No bruising, erythema, lesion or rash.  Neurological:     General: No focal deficit present.     Mental Status: He is alert and oriented to person, place, and time. Mental status is at baseline.     Cranial Nerves: No cranial nerve deficit.     Sensory: No sensory deficit.     Motor: No weakness.     Coordination: Coordination normal.     Gait: Gait normal.     Deep Tendon Reflexes: Reflexes normal.  Psychiatric:        Mood and Affect: Mood normal.        Behavior: Behavior normal.        Thought Content: Thought content normal.        Judgment: Judgment normal.       Assessment:     General medical exam  Cryptogenic stroke ([place])  Tobacco abuse  Encounter for screening for lung cancer  Smoker     Plan:  I am very pleased with the patient's lab work.  His sugar has improved.  Chronic kidney disease is stable.  Strongly encourage the patient to quit smoking.  Recommended he receive Shingrix.   His pneumonia vaccines are up-to-date.  Colonoscopy is due next year.  He is due for a CT scan to screen for lung cancer in February 2023.  He did have a 3 mm left lower lobe pulmonary nodule that needs to be monitored.  Does have an actinic keratosis on his left cheek that requires monitoring or treatment however he is moving to [place] and would like to defer treatment until he establishes down there.  I did refill his Valium that he uses sparingly.

## 2020-08-14 LAB — CBC WITH DIFFERENTIAL/PLATELET
Absolute Monocytes: 685 cells/uL (ref 200–950)
Basophils Absolute: 77 cells/uL (ref 0–200)
Basophils Relative: 1 %
Eosinophils Absolute: 208 cells/uL (ref 15–500)
Eosinophils Relative: 2.7 %
HCT: 45.2 % (ref 38.5–50.0)
Hemoglobin: 15.3 g/dL (ref 13.2–17.1)
Lymphs Abs: 2087 cells/uL (ref 850–3900)
MCH: 30.5 pg (ref 27.0–33.0)
MCHC: 33.8 g/dL (ref 32.0–36.0)
MCV: 90.2 fL (ref 80.0–100.0)
MPV: 9.7 fL (ref 7.5–12.5)
Monocytes Relative: 8.9 %
Neutro Abs: 4643 cells/uL (ref 1500–7800)
Neutrophils Relative %: 60.3 %
Platelets: 228 10*3/uL (ref 140–400)
RBC: 5.01 10*6/uL (ref 4.20–5.80)
RDW: 13.7 % (ref 11.0–15.0)
Total Lymphocyte: 27.1 %
WBC: 7.7 10*3/uL (ref 3.8–10.8)

## 2020-08-14 LAB — LIPID PANEL
Cholesterol: 151 mg/dL (ref <200)
HDL: 63 mg/dL (ref >=40)
LDL Cholesterol (Calc): 66 mg/dL (calc)
Non-HDL Cholesterol (Calc): 88 mg/dL (calc) (ref <130)
Total CHOL/HDL Ratio: 2.4 (calc) (ref <5.0)
Triglycerides: 139 mg/dL (ref <150)

## 2020-08-14 LAB — COMPLETE METABOLIC PANEL WITH GFR
AG Ratio: 2 (calc) (ref 1.0–2.5)
ALT: 19 U/L (ref 9–46)
AST: 15 U/L (ref 10–35)
Albumin: 4.1 g/dL (ref 3.6–5.1)
Alkaline phosphatase (APISO): 129 U/L (ref 35–144)
BUN: 14 mg/dL (ref 7–25)
CO2: 24 mmol/L (ref 20–32)
Calcium: 8.9 mg/dL (ref 8.6–10.3)
Chloride: 107 mmol/L (ref 98–110)
Creat: 1.24 mg/dL (ref 0.70–1.28)
Globulin: 2.1 g/dL (calc) (ref 1.9–3.7)
Glucose, Bld: 104 mg/dL — ABNORMAL HIGH (ref 65–99)
Potassium: 4.8 mmol/L (ref 3.5–5.3)
Sodium: 141 mmol/L (ref 135–146)
Total Bilirubin: 0.7 mg/dL (ref 0.2–1.2)
Total Protein: 6.2 g/dL (ref 6.1–8.1)
eGFR: 60 mL/min/{1.73_m2} (ref >=60)

## 2020-08-14 LAB — HEPATITIS C ANTIBODY
Hepatitis C Ab: NONREACTIVE
SIGNAL TO CUT-OFF: 0.01 (ref <1.00)

## 2020-08-14 LAB — PSA: PSA: 0.42 ng/mL (ref <=4.00)

## 2020-08-14 NOTE — Progress Notes (Signed)
Carelink Summary Report / Loop Recorder 

## 2020-08-20 ENCOUNTER — Ambulatory Visit (INDEPENDENT_AMBULATORY_CARE_PROVIDER_SITE_OTHER): Payer: Medicare Other

## 2020-08-20 DIAGNOSIS — I639 Cerebral infarction, unspecified: Secondary | ICD-10-CM

## 2020-08-21 LAB — CUP PACEART REMOTE DEVICE CHECK
Date Time Interrogation Session: 20220813000339
Implantable Pulse Generator Implant Date: 20210503

## 2020-08-22 NOTE — Chronic Care Management (AMB) (Signed)
  Chronic Care Management   Note  08/22/2020 Name: Christian [name] MRN: [id number] DOB: 12/26/1945  Christian [name] is a 75 y.o. year old male who is a primary care patient of Pickard, Cammie Mcgee, MD. I reached out to Christian [name] by phone today in response to a referral sent by Mr. [facility] PCP, Dr. Dennard Schaumann      Mr. Bonsell was given information about Chronic Care Management services today including:  CCM service includes personalized support from designated clinical staff supervised by his physician, including individualized plan of care and coordination with other care providers 24/7 contact phone numbers for assistance for urgent and routine care needs. Service will only be billed when office clinical staff spend 20 minutes or more in a month to coordinate care. Only one practitioner may furnish and bill the service in a calendar month. The patient may stop CCM services at any time (effective at the end of the month) by phone call to the office staff. The patient will be responsible for cost sharing (co-pay) of up to 20% of the service fee (after annual deductible is met).  Patient is no longer apart of practice. [place]  Direct Dial: [phone]

## 2020-09-07 NOTE — Progress Notes (Signed)
Carelink Summary Report / Loop Recorder 

## 2020-09-20 ENCOUNTER — Ambulatory Visit (INDEPENDENT_AMBULATORY_CARE_PROVIDER_SITE_OTHER): Payer: Medicare Other

## 2020-09-20 DIAGNOSIS — I639 Cerebral infarction, unspecified: Secondary | ICD-10-CM

## 2020-09-20 LAB — CUP PACEART REMOTE DEVICE CHECK
Date Time Interrogation Session: 20220915000916
Implantable Pulse Generator Implant Date: 20210503

## 2020-09-27 NOTE — Progress Notes (Signed)
Carelink Summary Report / Loop Recorder 

## 2020-10-22 ENCOUNTER — Ambulatory Visit (INDEPENDENT_AMBULATORY_CARE_PROVIDER_SITE_OTHER): Payer: Medicare Other

## 2020-10-22 DIAGNOSIS — I639 Cerebral infarction, unspecified: Secondary | ICD-10-CM

## 2020-10-24 LAB — CUP PACEART REMOTE DEVICE CHECK
Date Time Interrogation Session: 20221018000636
Implantable Pulse Generator Implant Date: 20210503

## 2020-10-31 NOTE — Progress Notes (Signed)
Carelink Summary Report / Loop Recorder 

## 2020-11-22 ENCOUNTER — Ambulatory Visit (INDEPENDENT_AMBULATORY_CARE_PROVIDER_SITE_OTHER): Payer: Medicare Other

## 2020-11-22 DIAGNOSIS — I639 Cerebral infarction, unspecified: Secondary | ICD-10-CM | POA: Diagnosis not present

## 2020-11-26 LAB — CUP PACEART REMOTE DEVICE CHECK
Date Time Interrogation Session: 20221120000919
Implantable Pulse Generator Implant Date: 20210503

## 2020-12-03 NOTE — Progress Notes (Signed)
Carelink Summary Report / Loop Recorder 

## 2020-12-28 ENCOUNTER — Ambulatory Visit (INDEPENDENT_AMBULATORY_CARE_PROVIDER_SITE_OTHER): Payer: Medicare Other

## 2020-12-28 DIAGNOSIS — I639 Cerebral infarction, unspecified: Secondary | ICD-10-CM

## 2020-12-28 LAB — CUP PACEART REMOTE DEVICE CHECK
Date Time Interrogation Session: 20221223000952
Implantable Pulse Generator Implant Date: 20210503

## 2021-01-08 NOTE — Progress Notes (Signed)
Carelink Summary Report / Loop Recorder 

## 2021-01-30 ENCOUNTER — Ambulatory Visit (INDEPENDENT_AMBULATORY_CARE_PROVIDER_SITE_OTHER): Payer: Medicare Other

## 2021-01-30 DIAGNOSIS — Z95818 Presence of other cardiac implants and grafts: Secondary | ICD-10-CM

## 2021-01-30 DIAGNOSIS — I639 Cerebral infarction, unspecified: Secondary | ICD-10-CM | POA: Diagnosis not present

## 2021-01-30 LAB — CUP PACEART REMOTE DEVICE CHECK
Date Time Interrogation Session: 20230125000950
Implantable Pulse Generator Implant Date: 20210503

## 2021-02-08 NOTE — Progress Notes (Signed)
Carelink Summary Report / Loop Recorder 

## 2021-03-04 ENCOUNTER — Ambulatory Visit (INDEPENDENT_AMBULATORY_CARE_PROVIDER_SITE_OTHER): Payer: Medicare Other

## 2021-03-04 DIAGNOSIS — I639 Cerebral infarction, unspecified: Secondary | ICD-10-CM

## 2021-03-04 LAB — CUP PACEART REMOTE DEVICE CHECK
Date Time Interrogation Session: 20230227000821
Implantable Pulse Generator Implant Date: 20210503

## 2021-03-08 NOTE — Progress Notes (Signed)
Carelink Summary Report / Loop Recorder 

## 2021-04-08 ENCOUNTER — Ambulatory Visit (INDEPENDENT_AMBULATORY_CARE_PROVIDER_SITE_OTHER): Payer: Medicare Other

## 2021-04-08 DIAGNOSIS — I639 Cerebral infarction, unspecified: Secondary | ICD-10-CM | POA: Diagnosis not present

## 2021-04-09 LAB — CUP PACEART REMOTE DEVICE CHECK
Date Time Interrogation Session: 20230401000757
Implantable Pulse Generator Implant Date: 20210503

## 2021-04-23 NOTE — Progress Notes (Signed)
Carelink Summary Report / Loop Recorder 

## 2021-05-09 LAB — CUP PACEART REMOTE DEVICE CHECK
Date Time Interrogation Session: 20230504001324
Implantable Pulse Generator Implant Date: 20210503

## 2021-05-13 ENCOUNTER — Ambulatory Visit (INDEPENDENT_AMBULATORY_CARE_PROVIDER_SITE_OTHER): Payer: Medicare Other

## 2021-05-13 DIAGNOSIS — I639 Cerebral infarction, unspecified: Secondary | ICD-10-CM | POA: Diagnosis not present

## 2021-05-25 ENCOUNTER — Other Ambulatory Visit: Payer: Self-pay | Admitting: Family Medicine

## 2021-05-27 NOTE — Telephone Encounter (Signed)
Requested medication (s) are due for refill today: Yes  Requested medication (s) are on the active medication list: Yes  Last refill:  06/14/20  Future visit scheduled: No  Notes to clinic:  Called pt. To make appointment.States "not a good time now." [place] up. Pt. Needs appointment.    Requested Prescriptions  Pending Prescriptions Disp Refills   clopidogrel (PLAVIX) 75 MG tablet [Pharmacy Med Name: CLOPIDOGREL 75 MG TABLET] 90 tablet 1    Sig: TAKE 1 TABLET BY MOUTH EVERY DAY     Hematology: Antiplatelets - clopidogrel Failed - 05/25/2021  2:23 AM      Failed - HCT in normal range and within 180 days    HCT  Date Value Ref Range Status  08/10/2020 45.2 38.5 - 50.0 % Final         Failed - HGB in normal range and within 180 days    Hemoglobin  Date Value Ref Range Status  08/10/2020 15.3 13.2 - 17.1 g/dL Final         Failed - PLT in normal range and within 180 days    Platelets  Date Value Ref Range Status  08/10/2020 228 140 - 400 Thousand/uL Final         Failed - Valid encounter within last 6 months    Recent Outpatient Visits           9 months ago General medical exam   [place] Susy Frizzle, MD   1 year ago Cryptogenic stroke [facility])   [place] Pickard, Cammie Mcgee, MD   1 year ago General medical exam   [place] Susy Frizzle, MD   2 years ago Cryptogenic stroke [facility])   [place] Pickard, Cammie Mcgee, MD   2 years ago General medical exam   [place], Warren T, MD               Passed - Cr in normal range and within 360 days    Creat  Date Value Ref Range Status  08/10/2020 1.24 0.70 - 1.28 mg/dL Final

## 2021-05-28 NOTE — Progress Notes (Signed)
Carelink Summary Report / Loop Recorder 

## 2021-06-17 ENCOUNTER — Ambulatory Visit (INDEPENDENT_AMBULATORY_CARE_PROVIDER_SITE_OTHER): Payer: Medicare Other

## 2021-06-17 DIAGNOSIS — I639 Cerebral infarction, unspecified: Secondary | ICD-10-CM

## 2021-06-19 LAB — CUP PACEART REMOTE DEVICE CHECK
Date Time Interrogation Session: 20230606001307
Implantable Pulse Generator Implant Date: 20210503

## 2021-06-21 ENCOUNTER — Other Ambulatory Visit: Payer: Self-pay | Admitting: Family Medicine

## 2021-07-05 ENCOUNTER — Other Ambulatory Visit: Payer: Self-pay | Admitting: Family Medicine

## 2021-07-08 ENCOUNTER — Encounter: Payer: Self-pay | Admitting: *Deleted

## 2021-07-08 NOTE — Telephone Encounter (Signed)
Pt has current rx and needs to schedule appt. Requested Prescriptions  Pending Prescriptions Disp Refills  . clopidogrel (PLAVIX) 75 MG tablet [Pharmacy Med Name: CLOPIDOGREL 75 MG TABLET] 90 tablet 1    Sig: TAKE 1 TABLET BY MOUTH EVERY DAY     Hematology: Antiplatelets - clopidogrel Failed - 07/05/2021  7:57 PM      Failed - HCT in normal range and within 180 days    HCT  Date Value Ref Range Status  08/10/2020 45.2 38.5 - 50.0 % Final         Failed - HGB in normal range and within 180 days    Hemoglobin  Date Value Ref Range Status  08/10/2020 15.3 13.2 - 17.1 g/dL Final         Failed - PLT in normal range and within 180 days    Platelets  Date Value Ref Range Status  08/10/2020 228 140 - 400 Thousand/uL Final         Failed - Valid encounter within last 6 months    Recent Outpatient Visits          10 months ago General medical exam   [place] Susy Frizzle, MD   1 year ago Cryptogenic stroke [facility])   [place] Pickard, Cammie Mcgee, MD   1 year ago General medical exam   [place] Susy Frizzle, MD   2 years ago Cryptogenic stroke [facility])   [place] Pickard, Cammie Mcgee, MD   2 years ago General medical exam   [place], Warren T, MD             Passed - Cr in normal range and within 360 days    Creat  Date Value Ref Range Status  08/10/2020 1.24 0.70 - 1.28 mg/dL Final

## 2021-07-18 LAB — CUP PACEART REMOTE DEVICE CHECK
Date Time Interrogation Session: 20230709000742
Implantable Pulse Generator Implant Date: 20210503

## 2021-07-22 ENCOUNTER — Ambulatory Visit (INDEPENDENT_AMBULATORY_CARE_PROVIDER_SITE_OTHER): Payer: Medicare Other

## 2021-07-22 DIAGNOSIS — I639 Cerebral infarction, unspecified: Secondary | ICD-10-CM

## 2021-07-25 ENCOUNTER — Telehealth: Payer: Self-pay

## 2021-07-25 MED ORDER — ATORVASTATIN CALCIUM 40 MG PO TABS: 40.0000 mg | ORAL_TABLET | Freq: Every day | ORAL | 0 refills | Status: DC

## 2021-07-25 NOTE — Telephone Encounter (Signed)
Pharmacy faxed a refill request for. atorvastatin (LIPITOR) 40 MG tablet [[id number]]    Order Details Dose, Route, Frequency: As Directed  Dispense Quantity: 90 tablet Refills: 3        Sig: TAKE 1 TABLET BY MOUTH EVERY DAY       Start Date: 07/25/20 End Date: --  Written Date: 07/25/20 Expiration Date: 07/25/21

## 2021-08-14 ENCOUNTER — Telehealth: Payer: Self-pay

## 2021-08-14 NOTE — Telephone Encounter (Signed)
Pt is now being followed by [facility] remotely. I cancelled all upcoming remotes and marked him inactive in [place]. I also released the patient in [place].

## 2021-08-19 NOTE — Progress Notes (Signed)
Carelink Summary Report / Loop Recorder 

## 2021-08-20 ENCOUNTER — Telehealth: Payer: Self-pay

## 2021-08-20 MED ORDER — ATORVASTATIN CALCIUM 40 MG PO TABS: 40.0000 mg | ORAL_TABLET | Freq: Every day | ORAL | 0 refills | Status: DC

## 2021-08-20 NOTE — Telephone Encounter (Signed)
Pharmacy faxed a refill request for atorvastatin (LIPITOR) 40 MG tablet [[id number]]    Order Details Dose: 40 mg Route: Oral Frequency: Daily  Dispense Quantity: 30 tablet Refills: 0   Note to Pharmacy: PT NEEDS OV W/PCP FOR FUTURE REFILLS       Sig: Take 1 tablet (40 mg total) by mouth daily.       Start Date: 07/25/21 End Date: --  Written Date: 07/25/21 Expiration Date: 07/25/22

## 2021-08-20 NOTE — Telephone Encounter (Signed)
Requested medication (s) are due for refill today: no   Requested medication (s) are on the active medication list: yes  Last refill:  07/25/21 #30 0 refills  Future visit scheduled: no  Notes to clinic:  called patient to schedule appt for medication refills. Patient reports he has moved to [place] and has a new provider. Patient reports he and his wife Elin Seats have new provider. Refill not needed.      Requested Prescriptions  Pending Prescriptions Disp Refills   atorvastatin (LIPITOR) 40 MG tablet 30 tablet 0    Sig: Take 1 tablet (40 mg total) by mouth daily.     Cardiovascular:  Antilipid - Statins Failed - 08/20/2021  1:56 PM      Failed - Valid encounter within last 12 months    Recent Outpatient Visits           1 year ago General medical exam   [place] Susy Frizzle, MD   1 year ago Cryptogenic stroke [facility])   [place] Pickard, Cammie Mcgee, MD   2 years ago General medical exam   [place] Susy Frizzle, MD   2 years ago Cryptogenic stroke [facility])   [place] Pickard, Cammie Mcgee, MD   3 years ago General medical exam   [place] Susy Frizzle, MD              Failed - Lipid Panel in normal range within the last 12 months    Cholesterol  Date Value Ref Range Status  08/10/2020 151 <200 mg/dL Final   LDL Cholesterol (Calc)  Date Value Ref Range Status  08/10/2020 66 mg/dL (calc) Final    Comment:    Reference range: <100 . Desirable range <100 mg/dL for primary prevention;   <70 mg/dL for patients with CHD or diabetic patients  with > or = 2 CHD risk factors. Mar[place] Kitchen LDL-C is now calculated using the Martin-Hopkins  calculation, which is a validated novel method providing  better accuracy than the Friedewald equation in the  estimation of LDL-C.  Cresenciano Genre et al. Annamaria Helling. 4193;790(24): 2061-2068  (http://education.[employer].com/faq/FAQ164)     HDL  Date Value Ref Range Status  08/10/2020 63 > OR = 40 mg/dL Final   Triglycerides  Date Value Ref Range Status  08/10/2020 139 <150 mg/dL Final         Passed - Patient is not pregnant

## 2021-09-20 NOTE — Telephone Encounter (Signed)
Called pt to schedule an appt for future refills. Pt stated that he has since relocated to [place], and will be getting his refills from his pcp there.

## 2021-09-23 ENCOUNTER — Other Ambulatory Visit: Payer: Self-pay | Admitting: Family Medicine

## 2021-09-23 MED ORDER — ATORVASTATIN CALCIUM 40 MG PO TABS: 40.0000 mg | ORAL_TABLET | Freq: Every day | ORAL | 1 refills | Status: AC

## 2021-10-27 IMAGING — CT CT RENAL STONE PROTOCOL
1 of 2 series · 14 of 32 positions shown, 19 images · non-contrast
Comparison: Partial comparison to CT chest dated 02/28/2020. Renal
ultrasound dated 08/16/2018.

CLINICAL DATA: Follow-up kidney stones

EXAM:
CT ABDOMEN AND PELVIS WITHOUT CONTRAST
TECHNIQUE: Multidetector CT imaging of the abdomen and pelvis was performed
following the standard protocol without IV contrast.

[Series 2: renal standard/full · axial · 0.82mm/px · z∈[-476,-56]mm · 14 of 96 slices shown, 19 images]
[im 6/96  soft-tissue]
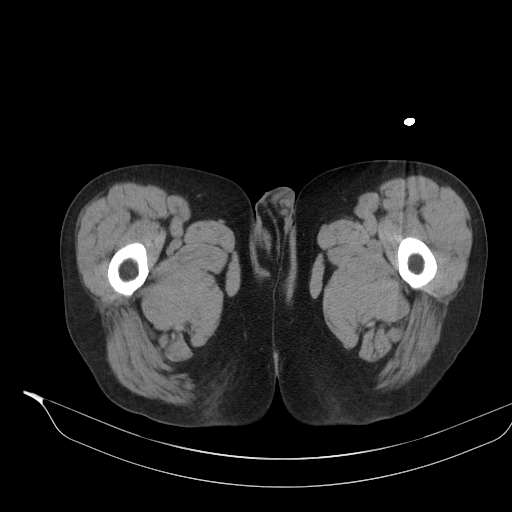
[im 6/96  bone]
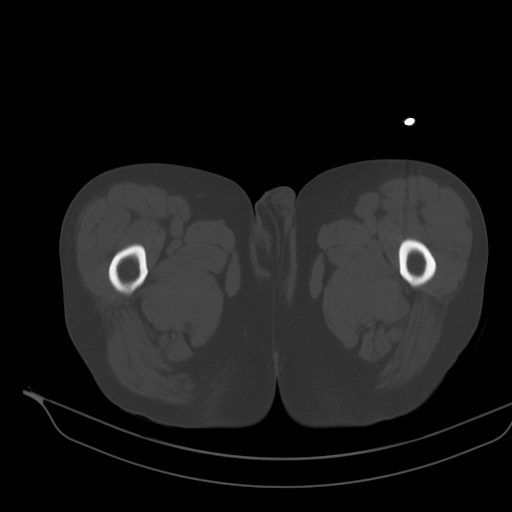
[im 11/96  soft-tissue]
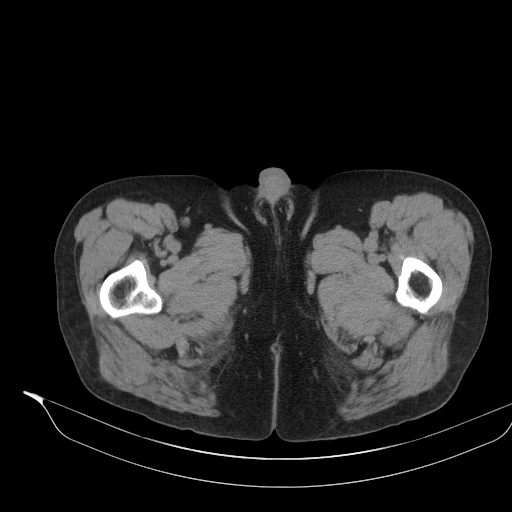
[im 22/96  soft-tissue]
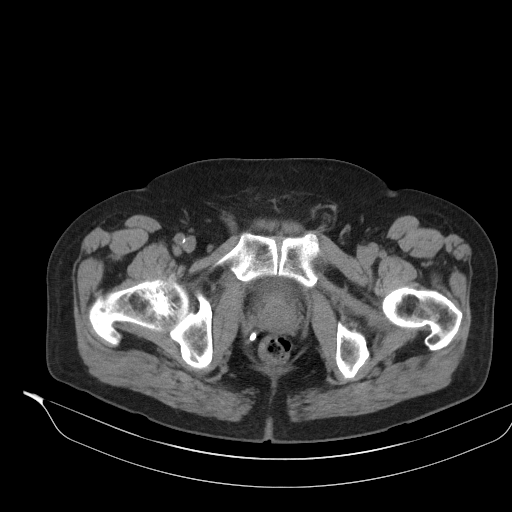
[im 27/96  soft-tissue]
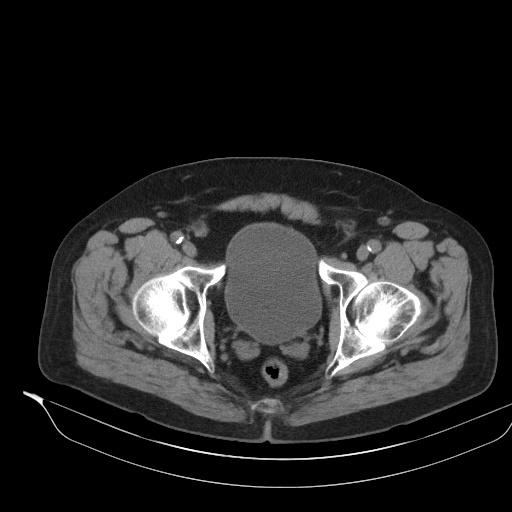
[im 32/96  soft-tissue]
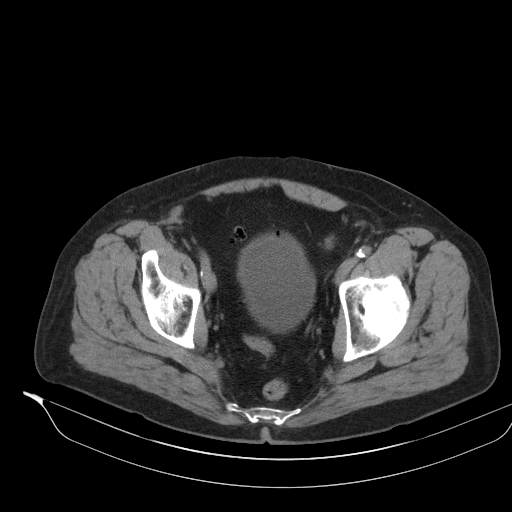
[im 43/96  soft-tissue]
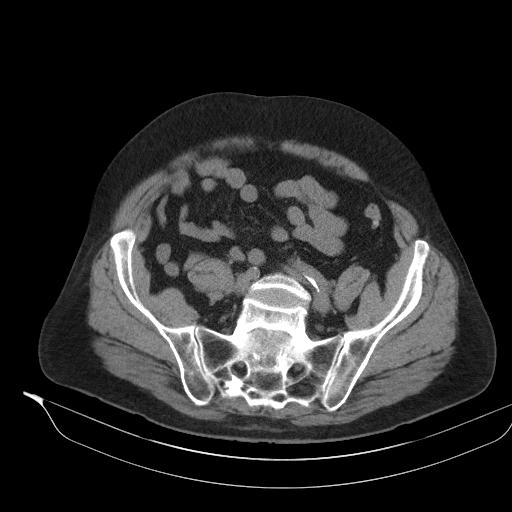
[im 48/96  soft-tissue]
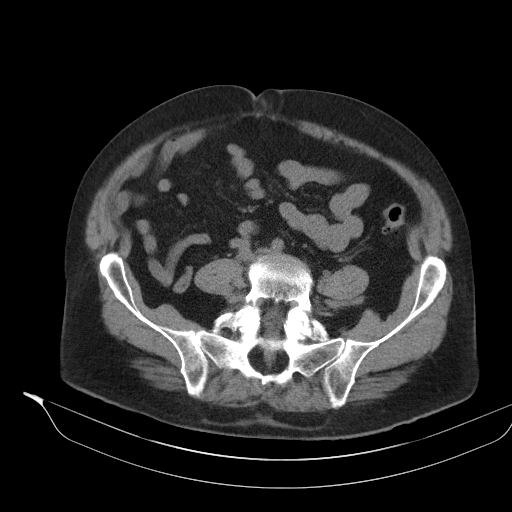
[im 53/96  soft-tissue]
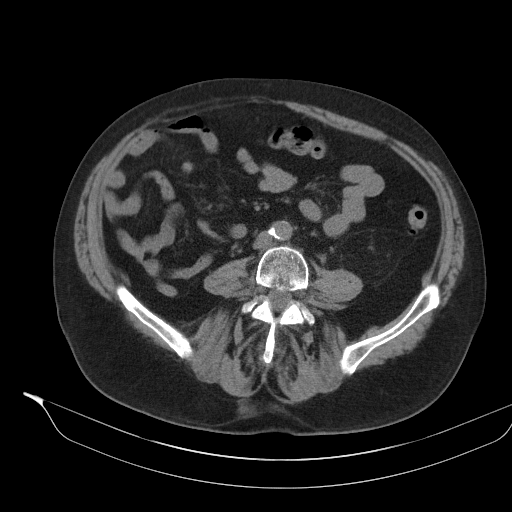
[im 64/96  soft-tissue]
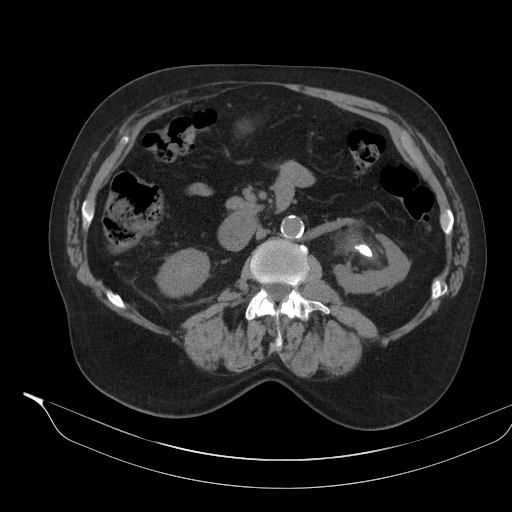
[im 64/96  bone]
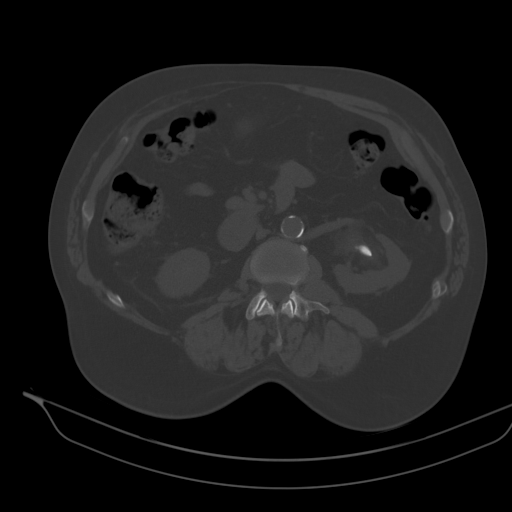
[im 69/96  soft-tissue]
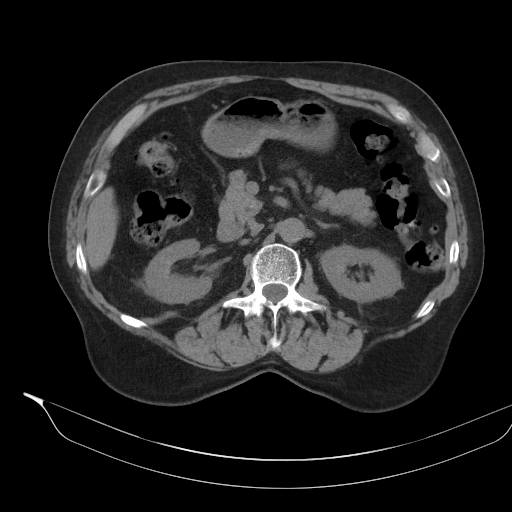
[im 74/96  soft-tissue]
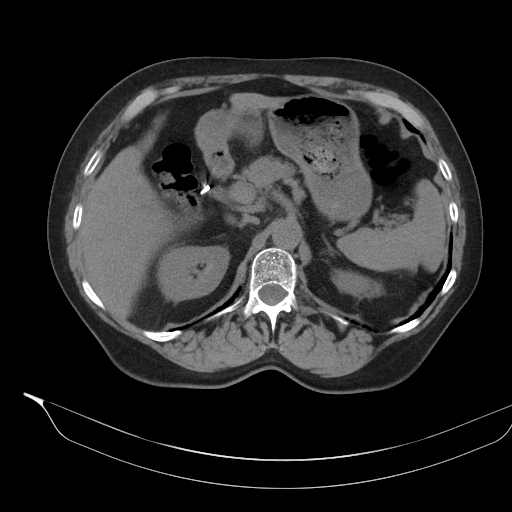
[im 74/96  lung]
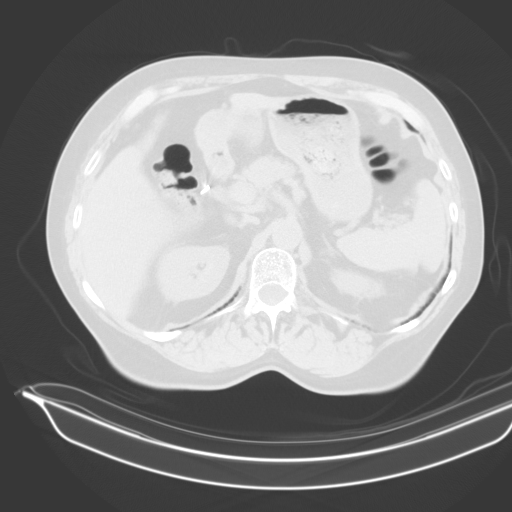
[im 80/96  lung]
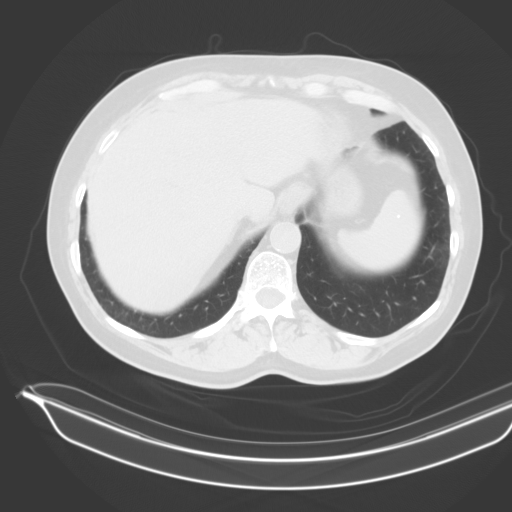
[im 85/96  soft-tissue]
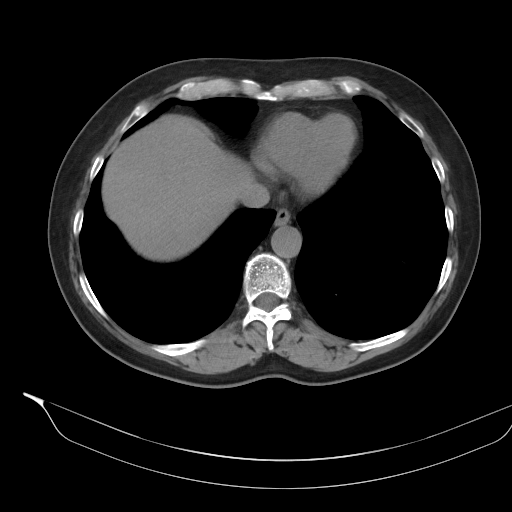
[im 85/96  lung]
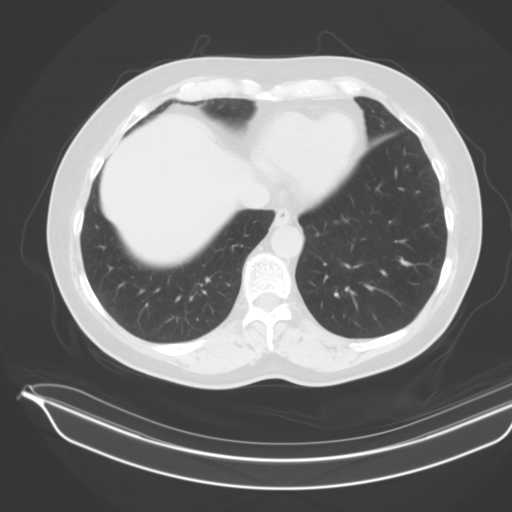
[im 90/96  soft-tissue]
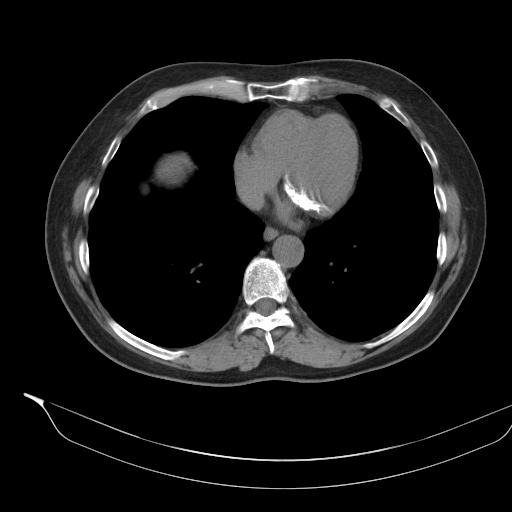
[im 90/96  lung]
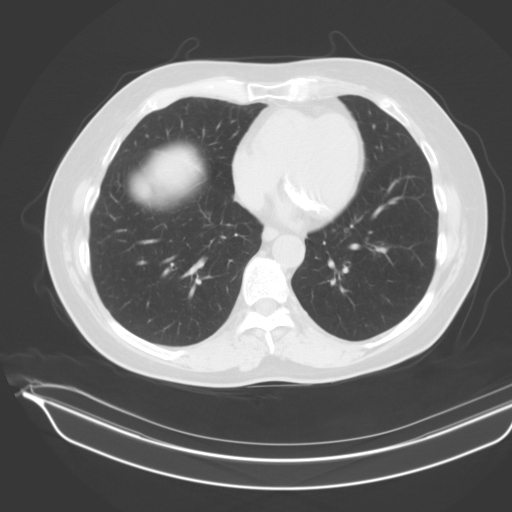

[14 of 32 positions shown; findings below may reference images not displayed]

FINDINGS: Lower chest: Lung bases are clear.

Hepatobiliary: Unenhanced liver is unremarkable.

Status post cholecystectomy. No intrahepatic or extrahepatic ductal
dilatation.

Pancreas: Within normal limits.

Spleen: Calcified splenic granulomata.

Adrenals/Urinary Tract: Adrenal glands are within normal limits.

Right kidney is within normal limits.

Two nonobstructing left upper pole renal calculi measuring 3 mm
(series 2/image 27). Three nonobstructing left lower pole renal
calculi measuring up to 5 mm (coronal image 65).

Additional 15 mm calculus in the left lower pole renal collecting
system (series 2/image 34) with mild fullness of the collecting
system and surrounding inflammatory changes.

No ureteral or bladder calculi.

Bladder is within normal limits.

Stomach/Bowel: Stomach is within normal limits.

No evidence of bowel obstruction.

Normal appendix (series 2/image 32).

Left colonic diverticulosis, without evidence of diverticulitis.

Vascular/Lymphatic: No evidence of abdominal aortic aneurysm.

Atherosclerotic calcifications of the abdominal aorta and branch
vessels.

No suspicious abdominopelvic lymphadenopathy.

Reproductive: Prostate is unremarkable.

Other: No abdominopelvic ascites.

Tiny fat containing left inguinal hernia (series 2/image 74). Tiny
fat containing periumbilical hernia (series 2/image 49).

Musculoskeletal: No focal osseous lesions.
IMPRESSION: 15 mm calculus in the left lower pole collecting system with mild
fullness and surrounding inflammatory changes.

Additional nonobstructing left renal calculi measuring up to 5 mm.

Additional ancillary findings as above.

## 2022-01-08 IMAGING — US US RENAL
1 series · 14 of 25 positions shown · non-contrast
Comparison: CT abdomen pelvis dated 05/01/2020.

CLINICAL DATA: 75-year-old male with follow-up kidney stone.

EXAM:
RENAL / URINARY TRACT ULTRASOUND COMPLETE

[Series 1: us renal · 14 of 44 slices shown]
[im 1/44]
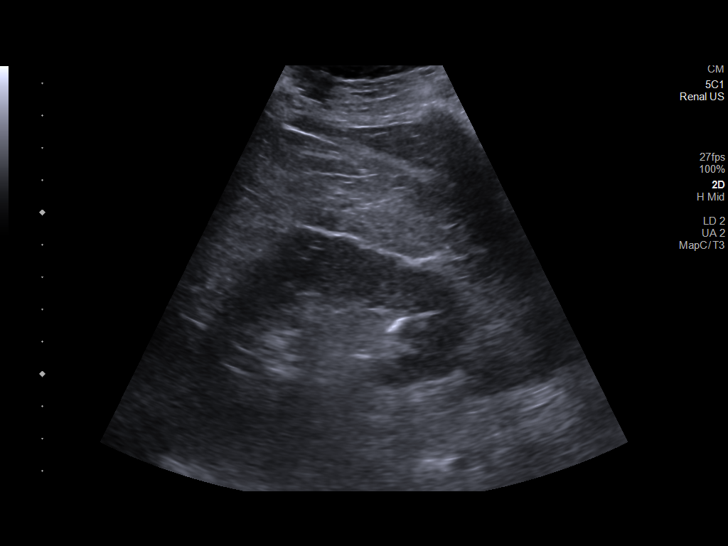
[im 4/44]
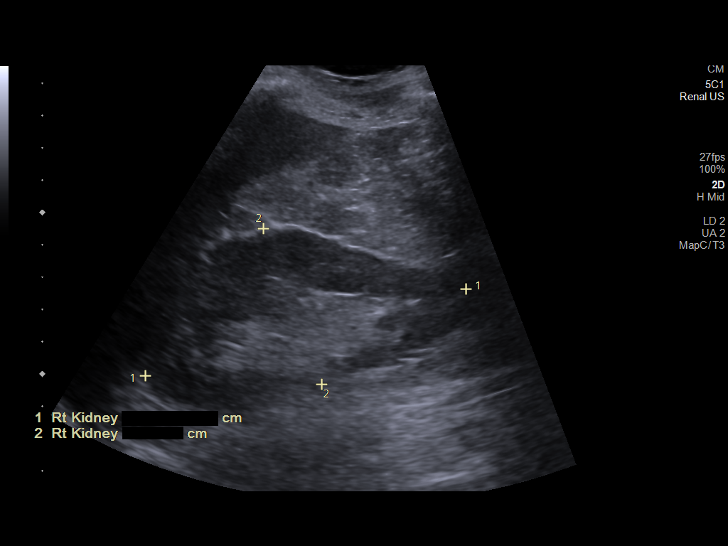
[im 8/44]
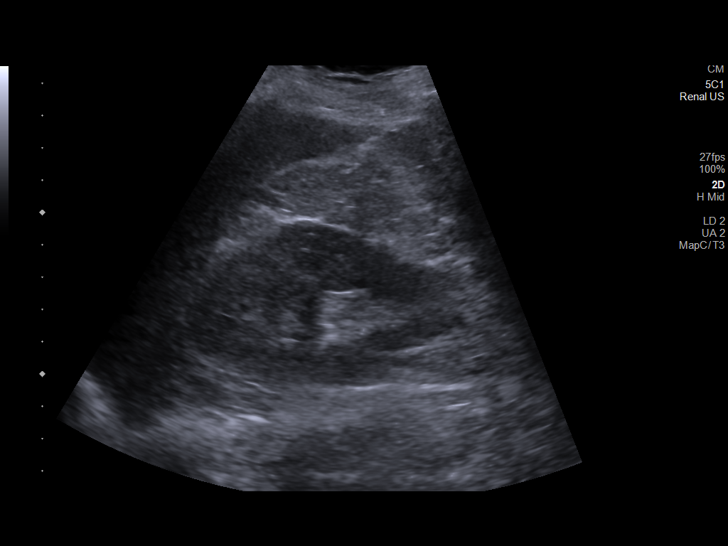
[im 11/44]
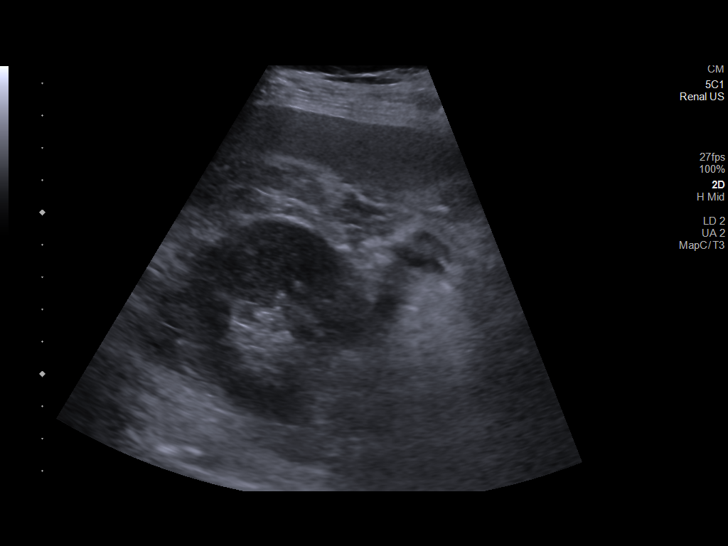
[im 15/44]
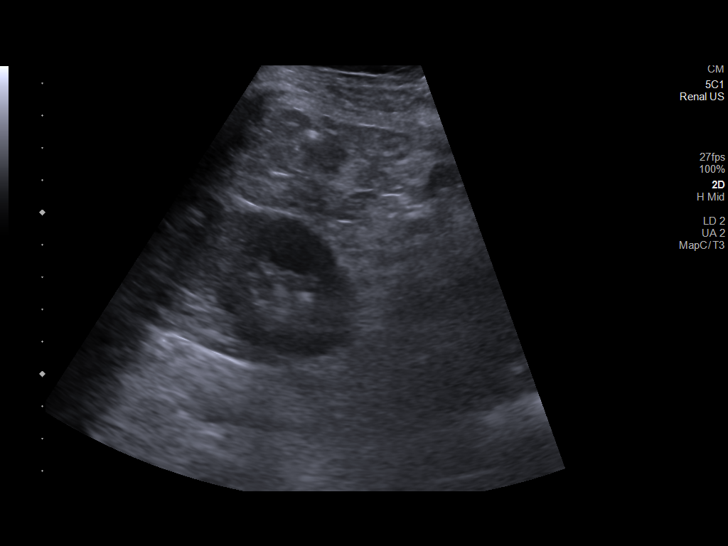
[im 17/44]
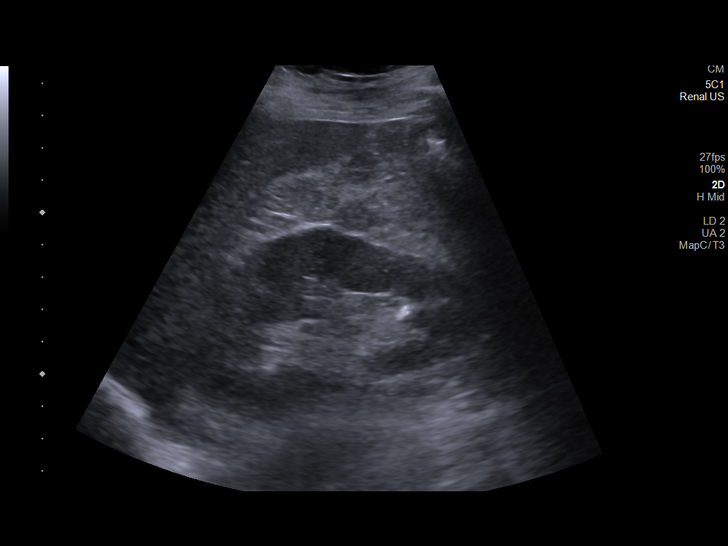
[im 20/44]
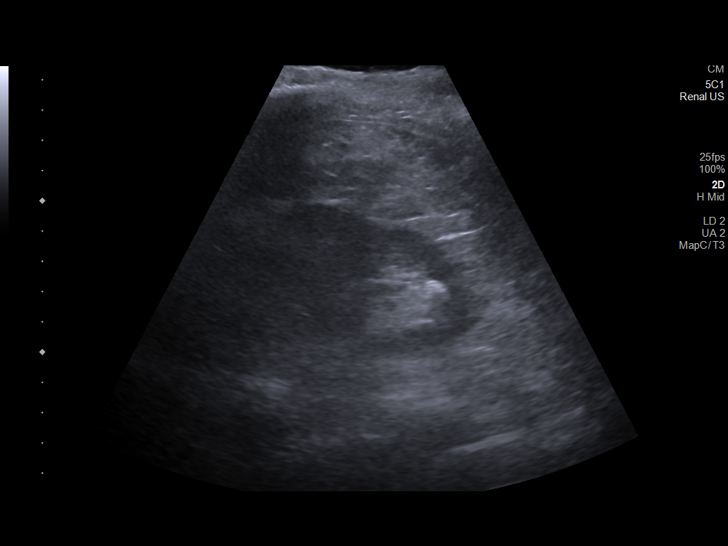
[im 24/44]
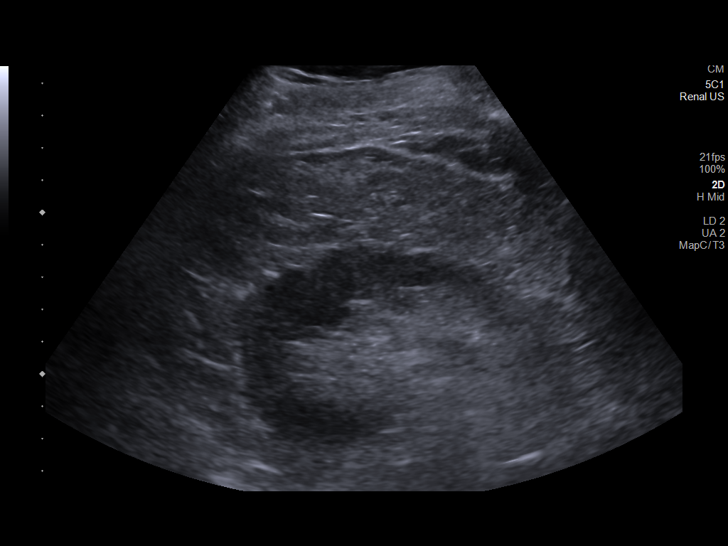
[im 27/44]
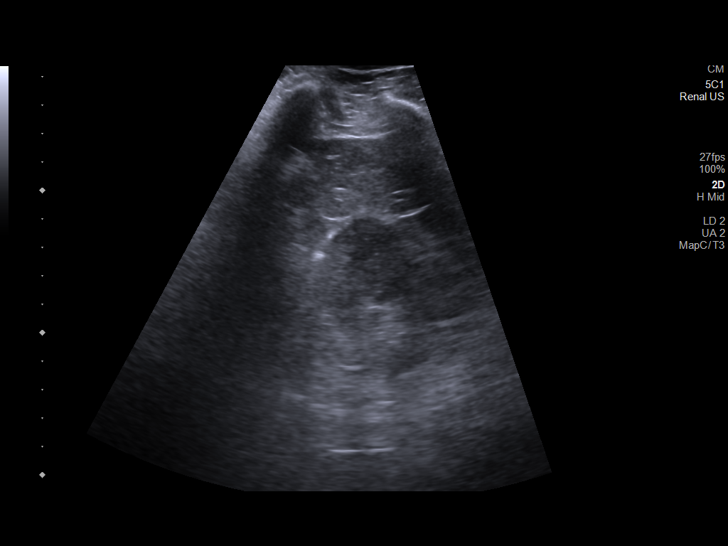
[im 29/44]
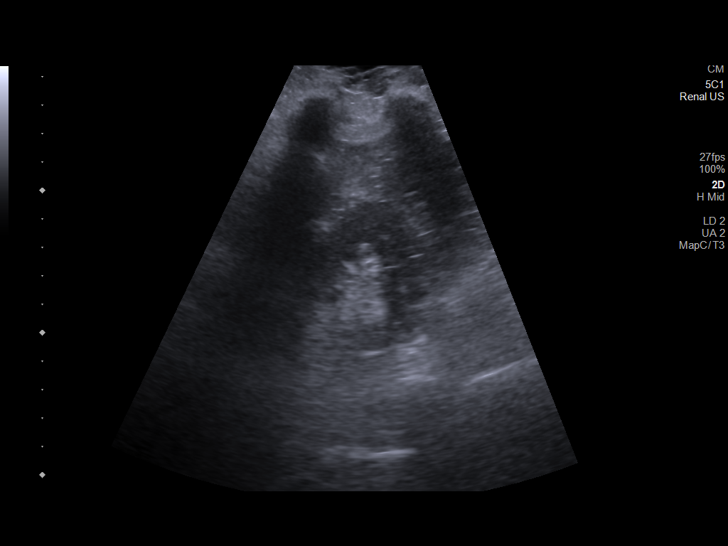
[im 33/44]
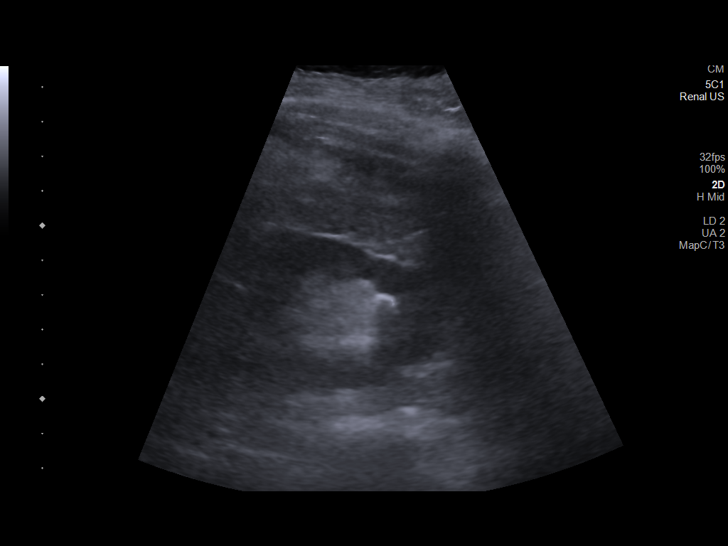
[im 36/44]
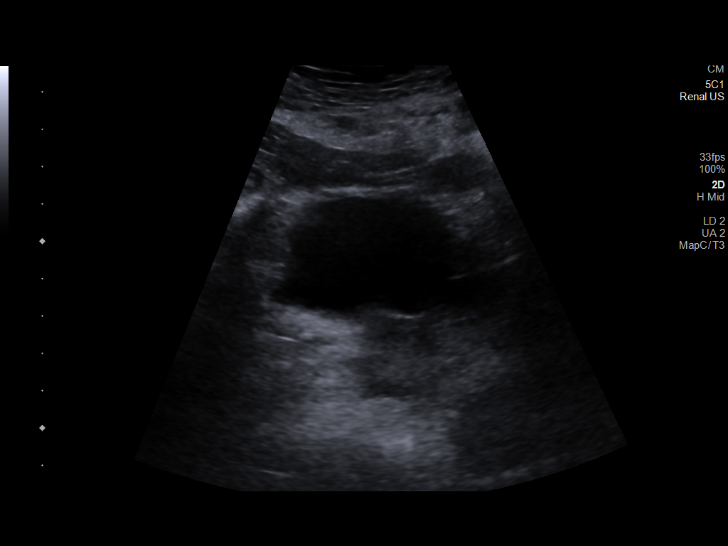
[im 40/44]
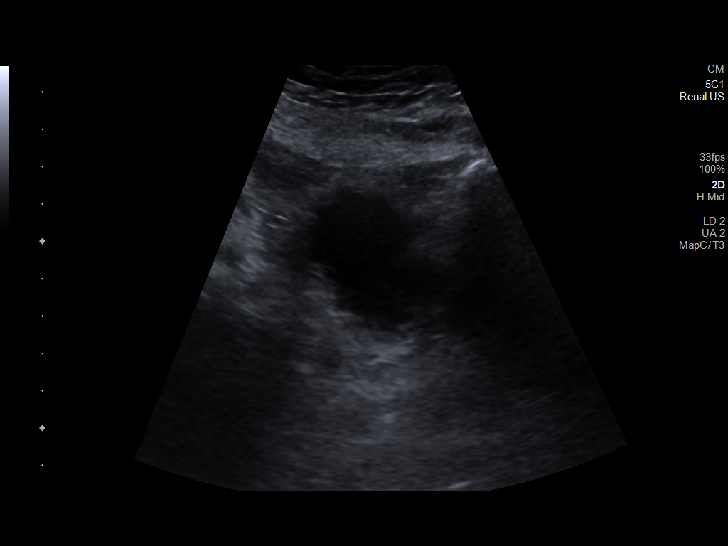
[im 44/44]
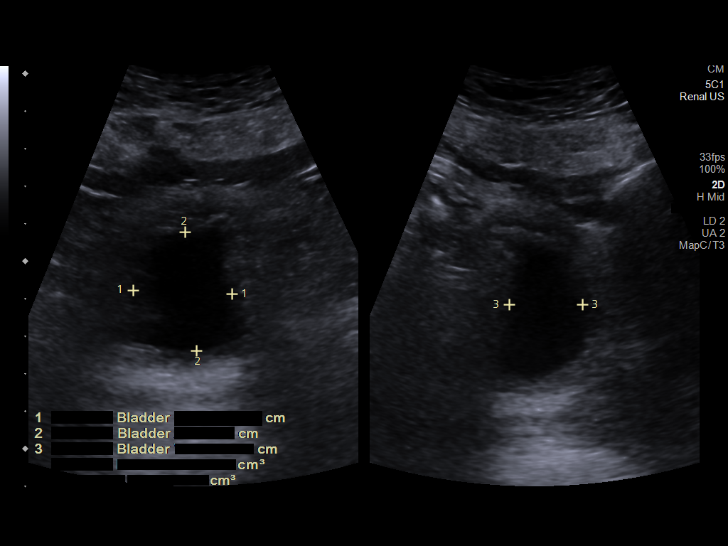

[14 of 25 positions shown; findings below may reference images not displayed]

FINDINGS: Right Kidney:

Renal measurements: 10.3 x 5.2 x 5.7 cm = volume: 157 mL. Normal
echogenicity. There is a 1 cm nonobstructing inferior pole calculus.
No hydronephrosis.

Left Kidney:

Renal measurements: 10.1 x 5.9 x 5.2 cm = volume: 160 mL. Normal
echogenicity. There is an 8 mm inferior pole calculus. No
hydronephrosis.

Bladder:

Appears normal for degree of bladder distention. The prevoid bladder
volume is 118 cc and postvoid volume of 9 cc.

Other:

None.
IMPRESSION: Nonobstructing bilateral renal calculi.  No hydronephrosis.

## 2022-07-11 ENCOUNTER — Other Ambulatory Visit: Payer: Self-pay

## 2022-07-11 ENCOUNTER — Emergency Department (HOSPITAL_COMMUNITY): Payer: Medicare Other

## 2022-07-11 ENCOUNTER — Encounter (HOSPITAL_COMMUNITY): Payer: Self-pay

## 2022-07-11 ENCOUNTER — Emergency Department (HOSPITAL_COMMUNITY)
Admission: EM | Admit: 2022-07-11 | Discharge: 2022-07-11 | Disposition: A | Discharge status: Home / Self Care | Payer: Medicare Other | Attending: Emergency Medicine | Admitting: Emergency Medicine

## 2022-07-11 DIAGNOSIS — W108XXA Fall (on) (from) other stairs and steps, initial encounter: Secondary | ICD-10-CM | POA: Insufficient documentation

## 2022-07-11 DIAGNOSIS — N189 Chronic kidney disease, unspecified: Secondary | ICD-10-CM | POA: Insufficient documentation

## 2022-07-11 DIAGNOSIS — Y92093 Driveway of other non-institutional residence as the place of occurrence of the external cause: Secondary | ICD-10-CM | POA: Diagnosis not present

## 2022-07-11 DIAGNOSIS — S20211A Contusion of right front wall of thorax, initial encounter: Secondary | ICD-10-CM

## 2022-07-11 DIAGNOSIS — I129 Hypertensive chronic kidney disease with stage 1 through stage 4 chronic kidney disease, or unspecified chronic kidney disease: Secondary | ICD-10-CM | POA: Insufficient documentation

## 2022-07-11 DIAGNOSIS — R059 Cough, unspecified: Secondary | ICD-10-CM | POA: Diagnosis not present

## 2022-07-11 DIAGNOSIS — Y9301 Activity, walking, marching and hiking: Secondary | ICD-10-CM | POA: Insufficient documentation

## 2022-07-11 DIAGNOSIS — S299XXA Unspecified injury of thorax, initial encounter: Secondary | ICD-10-CM | POA: Diagnosis present

## 2022-07-11 HISTORY — DX: Presence of other cardiac implants and grafts: Z95.818

## 2022-07-11 MED ORDER — ACETAMINOPHEN 500 MG PO TABS
1000.0000 mg | ORAL_TABLET | Freq: Once | ORAL | Status: AC
  Administered 2022-07-11: 1000 mg via ORAL
  Filled 2022-07-11: qty 2

## 2022-07-11 MED ORDER — LIDOCAINE 5 % EX PTCH
1.0000 | MEDICATED_PATCH | CUTANEOUS | Status: DC
  Administered 2022-07-11: 1 via TRANSDERMAL
  Filled 2022-07-11: qty 1

## 2022-07-11 NOTE — ED Notes (Signed)
Patient transported to X-ray 

## 2022-07-11 NOTE — ED Provider Notes (Signed)
[place] [facility]GENC[facility] DEPARTMENT AT [facility] Provider Note   CSN: [id number] Arrival date & time: 07/11/22  4098     History  Chief Complaint  Patient presents with   Rib Injury    Christian [name] is a 77 y.o. male.  77 year old male with a history of hypertension, hyperlipidemia, stroke without residual deficits, and CKD presents emergency department with fall.  Patient reports that on Wednesday night he was walking down some stairs and had a mechanical fall.  Did strike his right side on the ground.  No head strike or LOC.  Is on Plavix.  Says that since then has been having pain with breathing on the right side of his chest where he struck the ground.  Also has had a cough.  No fevers or chills.  No significant shortness of breath.       Home Medications Prior to Admission medications   Medication Sig Start Date End Date Taking? Authorizing Provider  acetaminophen (TYLENOL) 325 MG tablet Take 2 tablets (650 mg total) by mouth every 4 (four) hours as needed for mild pain (or temp > 37.5 C (99.5 F)). 05/09/19   Lonia Blood, MD  atorvastatin (LIPITOR) 40 MG tablet Take 1 tablet (40 mg total) by mouth daily. 09/23/21   Donita Brooks, MD  cetirizine (ZYRTEC) 10 MG tablet Take 10 mg by mouth daily.    [provider]  cholecalciferol (VITAMIN D) 1000 UNITS tablet Take 1,000 Units by mouth daily.    [provider]  clopidogrel (PLAVIX) 75 MG tablet TAKE 1 TABLET BY MOUTH EVERY DAY 06/21/21   Donita Brooks, MD  diazepam (VALIUM) 5 MG tablet Take 1 tablet (5 mg total) by mouth every 12 (twelve) hours as needed for anxiety. 08/13/20   Donita Brooks, MD  fluticasone (FLONASE) 50 MCG/ACT nasal spray PLACE 2 SPRAYS INTO EACH NOSTRIL DAILY Patient taking differently: Place 2 sprays into both nostrils daily. 10/26/19   Salley Scarlet, MD  Multiple Vitamins-Minerals (MULTIVITAMIN WITH MINERALS) tablet Take 1 tablet by mouth daily.    [provider]  vitamin C (ASCORBIC ACID) 500 MG tablet Take 500 mg by mouth daily.    [provider]      Allergies    Patient has no known allergies.    Review of Systems   Review of Systems  Physical Exam Updated Vital Signs BP (!) 143/86   Pulse 62   Resp 20   Ht 5\' 9"  (1.753 m)   Wt 72.6 kg   SpO2 97%   BMI 23.63 kg/m  Physical Exam Constitutional:      General: He is not in acute distress.    Appearance: Normal appearance. He is not ill-appearing.  HENT:     Head: Normocephalic and atraumatic.     Right Ear: External ear normal.     Left Ear: External ear normal.     Mouth/Throat:     Mouth: Mucous membranes are moist.     Pharynx: Oropharynx is clear.  Eyes:     Extraocular Movements: Extraocular movements intact.     Conjunctiva/sclera: Conjunctivae normal.     Pupils: Pupils are equal, round, and reactive to light.  Neck:     Comments: No C-spine midline tenderness to palpation Cardiovascular:     Rate and Rhythm: Normal rate and regular rhythm.     Pulses: Normal pulses.     Heart sounds: Normal heart sounds.  Comments: Right-sided chest wall tenderness to palpation in the anterior axillary line at approximately the eighth and ninth rib Pulmonary:     Effort: Pulmonary effort is normal.     Breath sounds: Normal breath sounds. No wheezing.  Abdominal:     General: Abdomen is flat. Bowel sounds are normal.     Palpations: Abdomen is soft.     Tenderness: There is no abdominal tenderness. There is no guarding.  Musculoskeletal:        General: No deformity. Normal range of motion.     Cervical back: No rigidity or tenderness.     Comments: No tenderness to palpation of midline thoracic or lumbar spine.  No step-offs palpated.  No bruising noted.  No tenderness to palpation of bilateral clavicles.  No tenderness to palpation, bruising, or deformities noted of bilateral shoulders, elbows, wrists, hips, knees, or ankles.    Neurological:      General: No focal deficit present.     Mental Status: He is alert and oriented to person, place, and time. Mental status is at baseline.     Cranial Nerves: No cranial nerve deficit.     Sensory: No sensory deficit.     Motor: No weakness.     ED Results / Procedures / Treatments   Labs (all labs ordered are listed, but only abnormal results are displayed) Labs Reviewed - No data to display  EKG EKG Interpretation Date/Time:  Friday July 11 2022 08:29:25 EDT Ventricular Rate:  64 PR Interval:  167 QRS Duration:  99 QT Interval:  427 QTC Calculation: 441 R Axis:   96  Text Interpretation: Sinus rhythm Right axis deviation Confirmed by Vonita Moss [phone]) on 07/11/2022 9:49:07 AM  Radiology DG Ribs Unilateral W/Chest Right  Result Date: 07/11/2022 CLINICAL DATA:  R rib pain EXAM: RIGHT RIBS AND CHEST - 3+ VIEW COMPARISON:  CT 02/28/2020 FINDINGS: No definite displaced fracture. No pneumothorax. No pleural effusion. Cholecystectomy clips. Left atrial clip. Aortic Atherosclerosis (ICD10-170.0). Implanted subcutaneous monitor overlies the lower left chest. IMPRESSION: No displaced rib fracture or other acute finding. Electronically Signed   By: Corlis Leak M.D.   On: 07/11/2022 10:05    Procedures Procedures    Medications Ordered in ED Medications  lidocaine (LIDODERM) 5 % 1 patch (1 patch Transdermal Patch Applied 07/11/22 0914)  acetaminophen (TYLENOL) tablet 1,000 mg (1,000 mg Oral Given 07/11/22 0901)    ED Course/ Medical Decision Making/ A&P                             Medical Decision Making Amount and/or Complexity of Data Reviewed Radiology: ordered.  Risk OTC drugs. Prescription drug management.   Christian [name] is a 77 y.o. male with comorbidities that complicate the patient evaluation including HTN, HLD, CKD, and stoke without deficits who presents with L sided chest pain and cough in the setting of chest trauma   Initial Ddx:  Bruised rib, broken rib,  pneumonia, atelectasis, pneumothorax  MDM/Course:  Concern the patient may have a bruised or broken rib given his fall initially.  Would be a little early for pneumonia to develop but did obtain a chest x-ray along with a rib series which did not show any broken ribs or signs of pneumonia.  No other systemic symptoms such as fever or chills that would suggest an occult pneumonia.  Upon re-evaluation patient's pain was well-controlled with Tylenol and lidocaine patch.  He was  given an incentive spirometer to ensure that he does not develop a pneumonia.  Instructed to follow-up with his primary doctor in several days and return to the emergency department for any concerning symptoms should occur.  This patient presents to the ED for concern of complaints listed in HPI, this involves an extensive number of treatment options, and is a complaint that carries with it a high risk of complications and morbidity. Disposition including potential need for admission considered.   Dispo: DC Home. Return precautions discussed including, but not limited to, those listed in the AVS. Allowed pt time to ask questions which were answered fully prior to dc.  Additional history obtained from spouse Records reviewed Outpatient Clinic Notes I independently reviewed the following imaging with scope of interpretation limited to determining acute life threatening conditions related to emergency care: Chest x-ray and agree with the radiologist interpretation with the following exceptions: none I personally reviewed and interpreted the pt's EKG: see above for interpretation  I have reviewed the patients home medications and made adjustments as needed Social Determinants of health:  Elderly          Final Clinical Impression(s) / ED Diagnoses Final diagnoses:  Contusion of rib on right side, initial encounter    Rx / DC Orders ED Discharge Orders     None         Rondel Baton, MD 07/11/22 1038

## 2022-07-11 NOTE — Discharge Instructions (Addendum)
You were seen for your rib pain in the emergency department.  It is likely that you have a bruised rib since your x-rays did not show any broken ribs.  At home, please use Tylenol and lidocaine patches over the counter for your pain.  Please also use the incentive spirometer we have given you to prevent any pneumonias.  Check your MyChart online for the results of any tests that had not resulted by the time you left the emergency department.   Follow-up with your primary doctor in 2-3 days regarding your visit.    Return immediately to the emergency department if you experience any of the following: Difficulty breathing, fever, severe pain, or any other concerning symptoms.    Thank you for visiting our Emergency Department. It was a pleasure taking care of you today.

## 2022-07-11 NOTE — ED Triage Notes (Signed)
Pt was walking in his driveway on Weds night and fell onto the gravel. Pt hurt his right elbow, bilateral knees, and c/o right rib cage. Pt states his right rib hurts during sleeping and coughing.
